# Patient Record
Sex: Male | Born: 1985 | Race: Black or African American | Hispanic: No | Marital: Single | Smoking: Current every day smoker
Health system: Southern US, Community
[De-identification: ages and names within clinical notes are randomized; demographics above are authoritative.]

## PROBLEM LIST (undated history)

## (undated) HISTORY — PX: OTHER SURGICAL HISTORY: SHX169

---

## 2003-01-10 ENCOUNTER — Emergency Department (HOSPITAL_COMMUNITY): Admission: EM | Admit: 2003-01-10 | Discharge: 2003-01-10 | Payer: Self-pay | Admitting: Emergency Medicine

## 2004-04-27 ENCOUNTER — Emergency Department (HOSPITAL_COMMUNITY): Admission: EM | Admit: 2004-04-27 | Discharge: 2004-04-28 | Payer: Self-pay | Admitting: Emergency Medicine

## 2004-05-31 ENCOUNTER — Emergency Department (HOSPITAL_COMMUNITY): Admission: EM | Admit: 2004-05-31 | Discharge: 2004-05-31 | Payer: Self-pay | Admitting: Family Medicine

## 2004-07-01 ENCOUNTER — Emergency Department (HOSPITAL_COMMUNITY): Admission: EM | Admit: 2004-07-01 | Discharge: 2004-07-01 | Payer: Self-pay | Admitting: Family Medicine

## 2004-08-10 ENCOUNTER — Emergency Department (HOSPITAL_COMMUNITY): Admission: EM | Admit: 2004-08-10 | Discharge: 2004-08-10 | Payer: Self-pay | Admitting: Family Medicine

## 2004-10-13 ENCOUNTER — Emergency Department (HOSPITAL_COMMUNITY): Admission: EM | Admit: 2004-10-13 | Discharge: 2004-10-13 | Payer: Self-pay | Admitting: Emergency Medicine

## 2004-10-16 ENCOUNTER — Emergency Department (HOSPITAL_COMMUNITY): Admission: EM | Admit: 2004-10-16 | Discharge: 2004-10-16 | Payer: Self-pay | Admitting: Family Medicine

## 2005-05-26 ENCOUNTER — Emergency Department (HOSPITAL_COMMUNITY): Admission: EM | Admit: 2005-05-26 | Discharge: 2005-05-26 | Payer: Self-pay | Admitting: Family Medicine

## 2005-08-29 ENCOUNTER — Emergency Department (HOSPITAL_COMMUNITY): Admission: EM | Admit: 2005-08-29 | Discharge: 2005-08-29 | Payer: Self-pay | Admitting: Family Medicine

## 2007-06-02 ENCOUNTER — Emergency Department (HOSPITAL_COMMUNITY): Admission: EM | Admit: 2007-06-02 | Discharge: 2007-06-03 | Payer: Self-pay | Admitting: Emergency Medicine

## 2007-06-05 ENCOUNTER — Emergency Department (HOSPITAL_COMMUNITY): Admission: EM | Admit: 2007-06-05 | Discharge: 2007-06-05 | Payer: Self-pay | Admitting: Emergency Medicine

## 2008-12-14 ENCOUNTER — Emergency Department (HOSPITAL_COMMUNITY): Admission: EM | Admit: 2008-12-14 | Discharge: 2008-12-14 | Payer: Self-pay | Admitting: Emergency Medicine

## 2009-05-02 ENCOUNTER — Emergency Department (HOSPITAL_COMMUNITY): Admission: EM | Admit: 2009-05-02 | Discharge: 2009-05-02 | Payer: Self-pay | Admitting: Emergency Medicine

## 2009-12-03 ENCOUNTER — Emergency Department (HOSPITAL_COMMUNITY): Admission: EM | Admit: 2009-12-03 | Discharge: 2009-12-03 | Payer: Self-pay | Admitting: Emergency Medicine

## 2010-06-06 LAB — HEMOCCULT GUIAC POC 1CARD (OFFICE): Fecal Occult Bld: NEGATIVE

## 2010-07-06 ENCOUNTER — Inpatient Hospital Stay (INDEPENDENT_AMBULATORY_CARE_PROVIDER_SITE_OTHER)
Admission: RE | Admit: 2010-07-06 | Discharge: 2010-07-06 | Disposition: A | Discharge status: Home / Self Care | Payer: BLUE CROSS/BLUE SHIELD | Source: Ambulatory Visit | Attending: Family Medicine | Admitting: Family Medicine

## 2010-07-06 DIAGNOSIS — S335XXA Sprain of ligaments of lumbar spine, initial encounter: Secondary | ICD-10-CM

## 2010-07-18 ENCOUNTER — Other Ambulatory Visit: Payer: Self-pay | Admitting: Occupational Medicine

## 2010-07-18 ENCOUNTER — Ambulatory Visit: Payer: Self-pay

## 2010-07-18 DIAGNOSIS — M549 Dorsalgia, unspecified: Secondary | ICD-10-CM

## 2010-07-31 ENCOUNTER — Emergency Department (HOSPITAL_COMMUNITY)
Admission: EM | Admit: 2010-07-31 | Discharge: 2010-08-01 | Disposition: A | Discharge status: Home / Self Care | Payer: No Typology Code available for payment source | Attending: Emergency Medicine | Admitting: Emergency Medicine

## 2010-07-31 DIAGNOSIS — T1490XA Injury, unspecified, initial encounter: Secondary | ICD-10-CM | POA: Insufficient documentation

## 2010-07-31 DIAGNOSIS — Y92410 Unspecified street and highway as the place of occurrence of the external cause: Secondary | ICD-10-CM | POA: Insufficient documentation

## 2010-07-31 DIAGNOSIS — M549 Dorsalgia, unspecified: Secondary | ICD-10-CM | POA: Insufficient documentation

## 2010-08-11 ENCOUNTER — Inpatient Hospital Stay (INDEPENDENT_AMBULATORY_CARE_PROVIDER_SITE_OTHER)
Admission: RE | Admit: 2010-08-11 | Discharge: 2010-08-11 | Disposition: A | Discharge status: Home / Self Care | Payer: Self-pay | Source: Ambulatory Visit | Attending: Emergency Medicine | Admitting: Emergency Medicine

## 2010-08-11 DIAGNOSIS — S40019A Contusion of unspecified shoulder, initial encounter: Secondary | ICD-10-CM

## 2010-08-11 DIAGNOSIS — M545 Low back pain: Secondary | ICD-10-CM

## 2010-08-19 ENCOUNTER — Emergency Department (HOSPITAL_COMMUNITY)
Admission: EM | Admit: 2010-08-19 | Discharge: 2010-08-19 | Disposition: A | Discharge status: Home / Self Care | Payer: No Typology Code available for payment source | Attending: Emergency Medicine | Admitting: Emergency Medicine

## 2010-08-19 ENCOUNTER — Emergency Department (HOSPITAL_COMMUNITY): Payer: No Typology Code available for payment source

## 2010-08-19 DIAGNOSIS — M25519 Pain in unspecified shoulder: Secondary | ICD-10-CM | POA: Insufficient documentation

## 2011-01-13 ENCOUNTER — Inpatient Hospital Stay (INDEPENDENT_AMBULATORY_CARE_PROVIDER_SITE_OTHER)
Admission: RE | Admit: 2011-01-13 | Discharge: 2011-01-13 | Disposition: A | Discharge status: Home / Self Care | Payer: BC Managed Care – PPO | Source: Ambulatory Visit | Attending: Emergency Medicine | Admitting: Emergency Medicine

## 2011-01-13 DIAGNOSIS — K5289 Other specified noninfective gastroenteritis and colitis: Secondary | ICD-10-CM

## 2011-04-17 ENCOUNTER — Encounter (HOSPITAL_COMMUNITY): Payer: Self-pay

## 2011-04-17 ENCOUNTER — Emergency Department (HOSPITAL_COMMUNITY)
Admission: EM | Admit: 2011-04-17 | Discharge: 2011-04-17 | Disposition: A | Discharge status: Home / Self Care | Payer: BC Managed Care – PPO | Source: Home / Self Care | Attending: Emergency Medicine | Admitting: Emergency Medicine

## 2011-04-17 DIAGNOSIS — J069 Acute upper respiratory infection, unspecified: Secondary | ICD-10-CM

## 2011-04-17 MED ORDER — GUAIFENESIN-CODEINE 100-10 MG/5ML PO SYRP: 5.0000 mL | ORAL_SOLUTION | Freq: Three times a day (TID) | ORAL | Status: AC | PRN

## 2011-04-17 MED ORDER — PREDNISONE 10 MG PO TABS: 20.0000 mg | ORAL_TABLET | Freq: Every day | ORAL | Status: AC

## 2011-04-17 NOTE — ED Notes (Signed)
C/o productive cough of green sputum, runny nose, nasal congestion since last Thursday.  states he awakened this am with headache and sore throat and feels really bad.

## 2011-04-17 NOTE — ED Provider Notes (Signed)
History     CSN: [id number]  Arrival date & time 04/17/11  1191   First MD Initiated Contact with Patient 04/17/11 [phone]      Chief Complaint  Patient presents with  . URI  . Sore Throat    (Consider location/radiation/quality/duration/timing/severity/associated sxs/prior treatment) HPI Comments: Still coughing with phlegm now, and nose still congested and runny nose, No SOB no fevers last few days, but now have a sore thraot  Patient is a 26 y.o. male presenting with URI and pharyngitis. The history is provided by the patient.  URI The primary symptoms include fever, fatigue, ear pain, sore throat and cough. Primary symptoms do not include wheezing, vomiting or rash. The current episode started 6 to 7 days ago. This is a new problem. The problem has not changed since onset. Symptoms associated with the illness include congestion and rhinorrhea.  Sore Throat Pertinent negatives include no shortness of breath.    History reviewed. No pertinent past medical history.  History reviewed. No pertinent past surgical history.  History reviewed. No pertinent family history.  History  Substance Use Topics  . Smoking status: Current Everyday Smoker -- 1.0 packs/day    Types: Cigarettes  . Smokeless tobacco: Not on file  . Alcohol Use: Yes     daily      Review of Systems  Constitutional: Positive for fever, appetite change and fatigue.  HENT: Positive for ear pain, congestion, sore throat and rhinorrhea.   Respiratory: Positive for cough. Negative for chest tightness, shortness of breath and wheezing.   Gastrointestinal: Negative for vomiting.  Skin: Negative for rash.    Allergies  Review of patient's allergies indicates no known allergies.  Home Medications   Current Outpatient Rx  Name Route Sig Dispense Refill  . GUAIFENESIN-CODEINE 100-10 MG/5ML PO SYRP Oral Take 5 mLs by mouth 3 (three) times daily as needed for cough. 120 mL 0  . PREDNISONE 10 MG PO TABS Oral  Take 2 tablets (20 mg total) by mouth daily. 15 tablet 0    BP 125/80  Pulse 97  Temp(Src) 99 F (37.2 C) (Oral)  Resp 14  SpO2 100%  Physical Exam  Nursing note reviewed. Constitutional: He appears well-developed and well-nourished. No distress.  HENT:  Head: Atraumatic.  Right Ear: No middle ear effusion.  Left Ear:  No middle ear effusion.  Nose: Rhinorrhea present.  Mouth/Throat: Uvula is midline and mucous membranes are normal. Posterior oropharyngeal erythema present.  Eyes: Conjunctivae are normal. Left eye exhibits no discharge.  Neck: Neck supple. No JVD present.  Cardiovascular: Normal rate and normal pulses.  Exam reveals no friction rub.   No murmur heard. Pulmonary/Chest: No respiratory distress. He has no decreased breath sounds. He has no wheezes. He has no rales. He exhibits no tenderness.  Lymphadenopathy:    He has no cervical adenopathy.    ED Course  Procedures (including critical care time)  Labs Reviewed - No data to display No results found.   1. Upper respiratory infection       MDM  URI x 6 days, with normal exam, symptomatic        Jimmie Molly, MD 04/17/11 1814

## 2011-09-29 ENCOUNTER — Emergency Department (HOSPITAL_COMMUNITY)
Admission: EM | Admit: 2011-09-29 | Discharge: 2011-09-30 | Disposition: A | Discharge status: Home / Self Care | Payer: BC Managed Care – PPO | Attending: Emergency Medicine | Admitting: Emergency Medicine

## 2011-09-29 ENCOUNTER — Encounter (HOSPITAL_COMMUNITY): Payer: Self-pay | Admitting: *Deleted

## 2011-09-29 DIAGNOSIS — X58XXXA Exposure to other specified factors, initial encounter: Secondary | ICD-10-CM | POA: Insufficient documentation

## 2011-09-29 DIAGNOSIS — S39012A Strain of muscle, fascia and tendon of lower back, initial encounter: Secondary | ICD-10-CM

## 2011-09-29 DIAGNOSIS — M545 Low back pain, unspecified: Secondary | ICD-10-CM | POA: Insufficient documentation

## 2011-09-29 DIAGNOSIS — F17200 Nicotine dependence, unspecified, uncomplicated: Secondary | ICD-10-CM | POA: Insufficient documentation

## 2011-09-29 DIAGNOSIS — S335XXA Sprain of ligaments of lumbar spine, initial encounter: Secondary | ICD-10-CM | POA: Insufficient documentation

## 2011-09-29 MED ORDER — HYDROCODONE-ACETAMINOPHEN 5-325 MG PO TABS
1.0000 | ORAL_TABLET | Freq: Once | ORAL | Status: AC
  Administered 2011-09-29: 1 via ORAL
  Filled 2011-09-29: qty 1

## 2011-09-29 MED ORDER — HYDROCODONE-ACETAMINOPHEN 5-325 MG PO TABS: ORAL_TABLET | ORAL | Status: AC

## 2011-09-29 MED ORDER — METHOCARBAMOL 500 MG PO TABS: 1000.0000 mg | ORAL_TABLET | Freq: Four times a day (QID) | ORAL | Status: AC

## 2011-09-29 MED ORDER — NAPROXEN 500 MG PO TABS: 500.0000 mg | ORAL_TABLET | Freq: Two times a day (BID) | ORAL | Status: DC

## 2011-09-29 NOTE — ED Provider Notes (Signed)
History     CSN: [id number]  Arrival date & time 09/29/11  2048   First MD Initiated Contact with Patient 09/29/11 2256      Chief Complaint  Patient presents with  . Back Pain    (Consider location/radiation/quality/duration/timing/severity/associated sxs/prior treatment) HPI Comments: Patient presents with complaint of lower back pain that began while he was working earlier today and has gradually progressed. Patient does a lot of heavy lifting at work. He denies acute injury. He denies red flag signs and symptoms of lower back pain. He denies treatments prior to arrival. Patient has a history of lower back strain. Pain does not radiate into his legs. He has not had difficulty walking. Onset was gradual. Course is constant. Nothing makes the symptoms better.   Patient is a 26 y.o. male presenting with back pain. The history is provided by the patient.  Back Pain  This is a new problem. The current episode started 6 to 12 hours ago. The problem occurs constantly. The problem has not changed since onset.The pain is associated with lifting heavy objects. The pain is present in the lumbar spine. The quality of the pain is described as aching. The pain does not radiate. The pain is moderate. The symptoms are aggravated by bending, certain positions and twisting. Pertinent negatives include no fever, no numbness, no weight loss, no bowel incontinence, no bladder incontinence, no dysuria, no tingling and no weakness. He has tried nothing for the symptoms. The treatment provided no relief.    History reviewed. No pertinent past medical history.  History reviewed. No pertinent past surgical history.  No family history on file.  History  Substance Use Topics  . Smoking status: Current Everyday Smoker -- 1.0 packs/day    Types: Cigarettes  . Smokeless tobacco: Not on file  . Alcohol Use: Yes     daily      Review of Systems  Constitutional: Negative for fever, weight loss and unexpected  weight change.  Gastrointestinal: Negative for constipation and bowel incontinence.       Neg for fecal incontinence  Genitourinary: Negative for bladder incontinence, dysuria, hematuria, flank pain and difficulty urinating.       Negative for urinary incontinence or retention  Musculoskeletal: Positive for back pain.  Neurological: Negative for tingling, weakness and numbness.       Negative for saddle paresthesias     Allergies  Review of patient's allergies indicates no known allergies.  Home Medications  No current outpatient prescriptions on file.  BP 126/88  Pulse 90  Temp 98.7 F (37.1 C) (Oral)  Resp 16  Ht 6' (1.829 m)  Wt 200 lb (90.719 kg)  BMI 27.12 kg/m2  SpO2 100%  Physical Exam  Nursing note and vitals reviewed. Constitutional: He is oriented to person, place, and time. He appears well-developed and well-nourished.  HENT:  Head: Normocephalic and atraumatic.  Eyes: Conjunctivae are normal.  Neck: Normal range of motion.  Abdominal: Soft. There is no tenderness. There is no CVA tenderness.  Musculoskeletal: Normal range of motion. He exhibits no tenderness.       Back:       There is no tenderness to palpation over cervical/thoracic/lumbar/sacral spine. Tenderness to palpation over lumbar paraspinal muscles. No step-off noted with palpation of spine.   Neurological: He is alert and oriented to person, place, and time. He has normal reflexes. No sensory deficit. He exhibits normal muscle tone.       5/5 strength in entire lower extremities  bilaterally. No sensation deficit.   Skin: Skin is warm and dry.  Psychiatric: He has a normal mood and affect.    ED Course  Procedures (including critical care time)  Labs Reviewed - No data to display No results found.   1. Lumbosacral strain     11:44 PM Patient seen and examined. Medications ordered.   Vital signs reviewed and are as follows: Filed Vitals:   09/29/11 2126  BP: 126/88  Pulse: 90  Temp:  98.7 F (37.1 C)  Resp: 16   No red flag s/s of low back pain. Patient was counseled on back pain precautions and told to do activity as tolerated but do not lift, push, or pull heavy objects more than 10 pounds for the next week.  Patient counseled to use ice or heat on back for no longer than 15 minutes every hour.   Patient prescribed muscle relaxer and counseled on proper use of muscle relaxant medication.    Patient prescribed narcotic pain medicine and counseled on proper use of narcotic pain medications. Counseled not to combine this medication with others containing tylenol.   Urged patient not to drink alcohol, drive, or perform any other activities that requires focus while taking either of these medications.  Patient urged to follow-up with PCP if pain does not improve with treatment and rest or if pain becomes recurrent. Urged to return with worsening severe pain, loss of bowel or bladder control, trouble walking.   The patient verbalizes understanding and agrees with the plan.  MDM  Patient with back pain. No neurological deficits. Patient is ambulatory. No warning symptoms of back pain including: loss of bowel or bladder control, night sweats, waking from sleep with back pain, unexplained fevers or weight loss, h/o cancer, IVDU, recent trauma. No concern for cauda equina, epidural abscess, or other serious cause of back pain. Conservative measures such as rest, ice/heat and pain medicine indicated with PCP follow-up if no improvement with conservative management.          Renne Crigler, [place] 10/02/11 1125

## 2011-09-29 NOTE — ED Notes (Signed)
Pt presents w/ back pain that began today - pt denies any known mechanism of injury, pt w/ hx of back pain in same area. Pt in no acute distress.

## 2011-09-29 NOTE — ED Notes (Signed)
Pt c/o reoccuring lower back pain x 1 day; back strain a year ago

## 2011-09-30 NOTE — ED Notes (Signed)
Rx given x3 Pt ambulating independently w/ steady gait on d/c in no acute distress, A&Ox4. D/c instructions reviewed w/ pt - pt denies any further questions or concerns at present.   

## 2011-10-03 NOTE — ED Provider Notes (Signed)
Medical screening examination/treatment/procedure(s) were performed by non-physician practitioner and as supervising physician I was immediately available for consultation/collaboration.    Celene Kras, MD 10/03/11 [phone]

## 2011-10-15 ENCOUNTER — Encounter (HOSPITAL_COMMUNITY): Payer: Self-pay | Admitting: Emergency Medicine

## 2011-10-15 ENCOUNTER — Emergency Department (HOSPITAL_COMMUNITY)
Admission: EM | Admit: 2011-10-15 | Discharge: 2011-10-15 | Disposition: A | Discharge status: Home / Self Care | Payer: BC Managed Care – PPO | Attending: Emergency Medicine | Admitting: Emergency Medicine

## 2011-10-15 ENCOUNTER — Emergency Department (HOSPITAL_COMMUNITY): Payer: BC Managed Care – PPO

## 2011-10-15 DIAGNOSIS — F17200 Nicotine dependence, unspecified, uncomplicated: Secondary | ICD-10-CM | POA: Insufficient documentation

## 2011-10-15 DIAGNOSIS — K6289 Other specified diseases of anus and rectum: Secondary | ICD-10-CM

## 2011-10-15 LAB — CBC
HCT: 44.3 % (ref 39.0–52.0)
MCHC: 34.1 g/dL (ref 30.0–36.0)
MCV: 93.3 fL (ref 78.0–100.0)
RBC: 4.75 MIL/uL (ref 4.22–5.81)
RDW: 13.4 % (ref 11.5–15.5)
WBC: 17.8 10*3/uL — ABNORMAL HIGH (ref 4.0–10.5)

## 2011-10-15 LAB — COMPREHENSIVE METABOLIC PANEL
AST: 26 U/L (ref 0–37)
Albumin: 4.2 g/dL (ref 3.5–5.2)
BUN: 15 mg/dL (ref 6–23)
Calcium: 9.6 mg/dL (ref 8.4–10.5)
GFR calc non Af Amer: 74 mL/min — ABNORMAL LOW (ref >90)
Total Bilirubin: 0.7 mg/dL (ref 0.3–1.2)
Total Protein: 7.5 g/dL (ref 6.0–8.3)

## 2011-10-15 MED ORDER — SODIUM CHLORIDE 0.9 % IV BOLUS (SEPSIS)
1000.0000 mL | Freq: Once | INTRAVENOUS | Status: AC
  Administered 2011-10-15: 1000 mL via INTRAVENOUS

## 2011-10-15 MED ORDER — MORPHINE SULFATE 4 MG/ML IJ SOLN
4.0000 mg | Freq: Once | INTRAMUSCULAR | Status: AC
  Administered 2011-10-15: 4 mg via INTRAVENOUS
  Filled 2011-10-15: qty 1

## 2011-10-15 MED ORDER — ONDANSETRON HCL 4 MG/2ML IJ SOLN
4.0000 mg | Freq: Once | INTRAMUSCULAR | Status: AC
  Administered 2011-10-15: 4 mg via INTRAVENOUS
  Filled 2011-10-15: qty 2

## 2011-10-15 MED ORDER — HYDROCODONE-ACETAMINOPHEN 5-500 MG PO TABS: 1.0000 | ORAL_TABLET | Freq: Four times a day (QID) | ORAL | Status: AC | PRN

## 2011-10-15 MED ORDER — DOCUSATE SODIUM 100 MG PO CAPS: 100.0000 mg | ORAL_CAPSULE | Freq: Two times a day (BID) | ORAL | Status: DC

## 2011-10-15 MED ORDER — HYDROCORTISONE 2.5 % RE CREA: TOPICAL_CREAM | RECTAL | Status: AC

## 2011-10-15 MED ORDER — KETOROLAC TROMETHAMINE 30 MG/ML IJ SOLN: 60.0000 mg | Freq: Once | INTRAMUSCULAR | Status: DC

## 2011-10-15 NOTE — ED Notes (Signed)
Rx given x3 Pt ambulating independently w/ steady gait on d/c in no acute distress, A&Ox4. D/c instructions reviewed w/ pt - pt denies any further questions or concerns at present.   

## 2011-10-15 NOTE — ED Notes (Signed)
Pt reports rectal pain denies bleeding hx rectal abscess

## 2011-10-15 NOTE — ED Notes (Signed)
Pt reports rectal pain x3-4 days, pt admits to straining w/ BMs however has been having regular bowel movements, pt states he feels pressure in rectal area "like a blockage," pt A&Ox4 in no acute distress on assessment. Pt has used cream and laxatives at home w/o relief. No obvious hemorrhoids noted on assessment.

## 2011-10-15 NOTE — ED Provider Notes (Signed)
History     CSN: [id number]  Arrival date & time 10/15/11  0000   First MD Initiated Contact with Patient 10/15/11 0139      Chief Complaint  Patient presents with  . Rectal Pain    (Consider location/radiation/quality/duration/timing/severity/associated sxs/prior treatment) Patient is a 26 y.o. male presenting with abdominal pain. The history is provided by the patient.  Abdominal Pain The primary symptoms of the illness include abdominal pain. Primary symptoms comment: rectal pain The current episode started yesterday. The onset of the illness was gradual. The problem has been gradually worsening.  Additional symptoms associated with the illness include constipation.    History reviewed. No pertinent past medical history.  Past Surgical History  Procedure Date  . Rectal abscess     History reviewed. No pertinent family history.  History  Substance Use Topics  . Smoking status: Current Everyday Smoker -- 1.0 packs/day    Types: Cigarettes  . Smokeless tobacco: Not on file  . Alcohol Use: Yes     daily      Review of Systems  Gastrointestinal: Positive for abdominal pain and constipation.  Genitourinary:       Rectal pain  All other systems reviewed and are negative.    Allergies  Review of patient's allergies indicates no known allergies.  Home Medications   Current Outpatient Rx  Name Route Sig Dispense Refill  . NAPROXEN 500 MG PO TABS Oral Take 1 tablet (500 mg total) by mouth 2 (two) times daily. 20 tablet 0    BP 98/54  Pulse 125  Temp 98.8 F (37.1 C) (Oral)  Resp 18  SpO2 97%  Physical Exam  Constitutional: He is oriented to person, place, and time. He appears well-developed and well-nourished.  HENT:  Head: Normocephalic and atraumatic.  Eyes: Conjunctivae are normal. Pupils are equal, round, and reactive to light.  Neck: Normal range of motion. Neck supple.  Cardiovascular: Regular rhythm, normal heart sounds and intact distal pulses.   Tachycardia present.   Pulmonary/Chest: Effort normal and breath sounds normal.  Abdominal: Soft. Bowel sounds are normal. There is no tenderness.  Genitourinary:       + rectal tenderness,  No bleeding,  No masses noted  Neurological: He is alert and oriented to person, place, and time.  Skin: Skin is warm and dry.  Psychiatric: He has a normal mood and affect. His behavior is normal. Judgment and thought content normal.    ED Course  Procedures (including critical care time)   Labs Reviewed  CBC   No results found.   No diagnosis found.    MDM  + rectal pain,  Tachycardia,  Hx of abscess,  Will ct pelvis,  Analgesia,  Ivf,  reassess        Juell Radney Lytle Michaels, MD 10/15/11 0155

## 2011-10-15 NOTE — ED Notes (Signed)
Patient transported to CT 

## 2011-10-16 ENCOUNTER — Emergency Department (HOSPITAL_COMMUNITY)
Admission: EM | Admit: 2011-10-16 | Discharge: 2011-10-16 | Disposition: A | Discharge status: Home / Self Care | Payer: BC Managed Care – PPO | Attending: Emergency Medicine | Admitting: Emergency Medicine

## 2011-10-16 ENCOUNTER — Encounter (HOSPITAL_COMMUNITY): Payer: Self-pay | Admitting: *Deleted

## 2011-10-16 DIAGNOSIS — K611 Rectal abscess: Secondary | ICD-10-CM

## 2011-10-16 DIAGNOSIS — F17200 Nicotine dependence, unspecified, uncomplicated: Secondary | ICD-10-CM | POA: Insufficient documentation

## 2011-10-16 DIAGNOSIS — K612 Anorectal abscess: Secondary | ICD-10-CM | POA: Insufficient documentation

## 2011-10-16 MED ORDER — MORPHINE SULFATE 4 MG/ML IJ SOLN
8.0000 mg | Freq: Once | INTRAMUSCULAR | Status: DC
  Filled 2011-10-16: qty 2

## 2011-10-16 MED ORDER — OXYCODONE-ACETAMINOPHEN 5-325 MG PO TABS: 1.0000 | ORAL_TABLET | ORAL | Status: AC | PRN

## 2011-10-16 MED ORDER — NAPROXEN 500 MG PO TABS: 500.0000 mg | ORAL_TABLET | Freq: Two times a day (BID) | ORAL | Status: AC

## 2011-10-16 MED ORDER — SULFAMETHOXAZOLE-TRIMETHOPRIM 800-160 MG PO TABS: 1.0000 | ORAL_TABLET | Freq: Two times a day (BID) | ORAL | Status: AC

## 2011-10-16 MED ORDER — SULFAMETHOXAZOLE-TMP DS 800-160 MG PO TABS
1.0000 | ORAL_TABLET | Freq: Once | ORAL | Status: AC
  Administered 2011-10-16: 1 via ORAL
  Filled 2011-10-16: qty 1

## 2011-10-16 NOTE — ED Provider Notes (Signed)
History     CSN: [id number]  Arrival date & time 10/16/11  1478   First MD Initiated Contact with Patient 10/16/11 [phone]      Chief Complaint  Patient presents with  . Rectal Pain    (Consider location/radiation/quality/duration/timing/severity/associated sxs/prior treatment) HPI Comments: 26 year old male with a history of rectal pain which started several days ago. He was seen in the emergency department had a rectal exam which was tender but no masses were seen at that time. He also had a CT scan of the abdomen and pelvis showing no signs of perirectal or perianal abscesses. He states over the last 24 hours he feels like there is a mass that has increased in size in the right perirectal area which is worse with bowel movements, sitting and touching. He denies fevers chills nausea or vomiting. The symptoms are persistent, gradually getting worse.  The history is provided by the patient and medical records.    History reviewed. No pertinent past medical history.  Past Surgical History  Procedure Date  . Rectal abscess     No family history on file.  History  Substance Use Topics  . Smoking status: Current Everyday Smoker -- 1.0 packs/day    Types: Cigarettes  . Smokeless tobacco: Not on file  . Alcohol Use: Yes     daily      Review of Systems  Constitutional: Negative for fever and chills.  Gastrointestinal: Positive for rectal pain. Negative for nausea and vomiting.  Skin:       abscess    Allergies  Shellfish allergy  Home Medications   Current Outpatient Rx  Name Route Sig Dispense Refill  . HYDROCODONE-ACETAMINOPHEN 5-500 MG PO TABS Oral Take 1-2 tablets by mouth every 6 (six) hours as needed for pain. 15 tablet 0  . HYDROCORTISONE 2.5 % RE CREA  Apply rectally 2 times daily 30 g 0  . NAPROXEN 500 MG PO TABS Oral Take 1 tablet (500 mg total) by mouth 2 (two) times daily with a meal. 30 tablet 0  . OXYCODONE-ACETAMINOPHEN 5-325 MG PO TABS Oral Take 1  tablet by mouth every 4 (four) hours as needed for pain. 20 tablet 0  . SULFAMETHOXAZOLE-TRIMETHOPRIM 800-160 MG PO TABS Oral Take 1 tablet by mouth every 12 (twelve) hours. 20 tablet 0    BP 140/69  Pulse 118  Temp 98.2 F (36.8 C) (Oral)  Resp 20  SpO2 99%  Physical Exam  Nursing note and vitals reviewed. Constitutional: He appears well-developed and well-nourished.       Uncomfortable appearing  HENT:  Head: Normocephalic and atraumatic.  Eyes: Conjunctivae are normal. Right eye exhibits no discharge. Left eye exhibits no discharge. No scleral icterus.  Cardiovascular: Normal rate and regular rhythm.   No murmur heard. Pulmonary/Chest: Effort normal and breath sounds normal.  Abdominal: Soft. There is no tenderness.  Genitourinary:       Tender swollen indurated area without fluctuance in the left superior perirectal area. No tenderness in the perineum otherwise  Musculoskeletal: He exhibits tenderness. He exhibits no edema.  Skin: Skin is warm and dry. He is not diaphoretic.       Swollen area in the left superior perirectal area. Tenderness with palpation, indurated skin, no areas of fluctuance.    ED Course  Procedures (including critical care time)  Labs Reviewed - No data to display Ct Abdomen Pelvis Wo Contrast  10/15/2011  *RADIOLOGY REPORT*  Clinical Data: Rectal pain and pressure.  CT ABDOMEN AND  PELVIS WITHOUT CONTRAST  Technique:  Multidetector CT imaging of the abdomen and pelvis was performed following the standard protocol without intravenous contrast.  Comparison: None.  Findings: The patient was unable to lay supine and accordingly was scanned in the prone position.  Subsegmental atelectasis noted in the right middle lobe.  The visualized portion of the liver, spleen, pancreas, and adrenal glands appear unremarkable in noncontrast CT appearance.  The gallbladder is moderately contracted. The kidneys appear unremarkable, as do the proximal ureters.  Distal ureters  appear normal.  No pathologic retroperitoneal or porta hepatis adenopathy is identified.  The appendix appears normal. No pathologic pelvic adenopathy is identified.  There are some distal air-fluid levels in the colon and in the rectum, suggesting constipation.  No obvious rectal mass is observed on noncontrast CT.  No abnormal stranding in the issue rectal fossa.  IMPRESSION:  1.  Air fluid levels in the distal colon raise the possibility of diarrheal process.   Otherwise, no significant abnormality identified.  Original Report Authenticated By: Dellia Cloud, M.D.     1. Perirectal abscess       MDM  The patient appears to have an early perirectal abscess, we'll perform ultrasound on this abscess to evaluate size and evaluate indications for incision and drainage versus antibiotic therapy. Intramuscular morphine ordered. CT scan results reviewed, see above.  Bedside US shows no identifiable fluid collection =- appears c/w early infection - bactrim ordered, pt drove self and requests Rx for meds rather than IM morphine.  No fever.    Discharge Prescriptions include:  Bactrim Naprosyn Percocet  Resource list.      Vida Roller, MD 10/16/11 [phone]

## 2011-10-16 NOTE — ED Notes (Signed)
Dr Miller at bedside. 

## 2011-10-16 NOTE — ED Notes (Signed)
Pt was tx here Tues night for rectal pain and dx with proctalgia fugax.  He was told to come back if the pain increased.  Pt has taken all medications except for the stool softener which he could not afford.  Pt appears in great pain.

## 2012-10-18 ENCOUNTER — Emergency Department (INDEPENDENT_AMBULATORY_CARE_PROVIDER_SITE_OTHER)
Admission: EM | Admit: 2012-10-18 | Discharge: 2012-10-18 | Disposition: A | Discharge status: Home / Self Care | Payer: BC Managed Care – PPO | Source: Home / Self Care | Attending: Family Medicine | Admitting: Family Medicine

## 2012-10-18 ENCOUNTER — Encounter (HOSPITAL_COMMUNITY): Payer: Self-pay | Admitting: Emergency Medicine

## 2012-10-18 DIAGNOSIS — M75100 Unspecified rotator cuff tear or rupture of unspecified shoulder, not specified as traumatic: Secondary | ICD-10-CM

## 2012-10-18 DIAGNOSIS — M25519 Pain in unspecified shoulder: Secondary | ICD-10-CM

## 2012-10-18 DIAGNOSIS — M25512 Pain in left shoulder: Secondary | ICD-10-CM

## 2012-10-18 DIAGNOSIS — IMO0002 Reserved for concepts with insufficient information to code with codable children: Secondary | ICD-10-CM

## 2012-10-18 DIAGNOSIS — M7552 Bursitis of left shoulder: Secondary | ICD-10-CM

## 2012-10-18 MED ORDER — TRIAMCINOLONE ACETONIDE 40 MG/ML IJ SUSP
INTRAMUSCULAR | Status: AC
Start: 2012-10-18 — End: 2012-10-18
  Filled 2012-10-18: qty 1

## 2012-10-18 MED ORDER — IBUPROFEN 800 MG PO TABS: 800.0000 mg | ORAL_TABLET | Freq: Three times a day (TID) | ORAL | Status: DC

## 2012-10-18 MED ORDER — HYDROCODONE-ACETAMINOPHEN 10-325 MG PO TABS: 1.0000 | ORAL_TABLET | Freq: Four times a day (QID) | ORAL | Status: DC | PRN

## 2012-10-18 NOTE — ED Provider Notes (Signed)
CSN: [id number]     Arrival date & time 10/18/12  1840 History     None    Chief Complaint  Patient presents with  . Shoulder Pain   (Consider location/radiation/quality/duration/timing/severity/associated sxs/prior Treatment) HPI Comments: 27 year old male with a history of bursitis in both shoulders presents complaining of pain in his left shoulder, worse with movement. He has had this exact pain before when he was diagnosed with bursitis but before it was worse in the right shoulder. This pain was present when he woke up yesterday morning. The day before, he did do a lot of lifting of heavy objects at work but there is no specific injury and he did not have pain when he went to sleep at night. He says the pain is severe, 10 out of 10 with moving. Denies any swelling or history of shoulder injury.   History reviewed. No pertinent past medical history. Past Surgical History  Procedure Laterality Date  . Rectal abscess     No family history on file. History  Substance Use Topics  . Smoking status: Current Every Day Smoker -- 1.00 packs/day    Types: Cigarettes  . Smokeless tobacco: Not on file  . Alcohol Use: Yes     Comment: daily    Review of Systems  Constitutional: Negative for fever, chills and fatigue.  HENT: Negative for sore throat, neck pain and neck stiffness.   Eyes: Negative for visual disturbance.  Respiratory: Negative for cough and shortness of breath.   Cardiovascular: Negative for chest pain, palpitations and leg swelling.  Gastrointestinal: Negative for nausea, vomiting, abdominal pain, diarrhea and constipation.  Genitourinary: Negative for dysuria, urgency, frequency and hematuria.  Musculoskeletal: Positive for arthralgias. Negative for myalgias.  Skin: Negative for rash.  Neurological: Negative for dizziness, weakness and light-headedness.    Allergies  Shellfish allergy  Home Medications   Current Outpatient Rx  Name  Route  Sig  Dispense  Refill   . HYDROcodone-acetaminophen (NORCO) 10-325 MG per tablet   Oral   Take 1 tablet by mouth every 6 (six) hours as needed for pain.   20 tablet   0   . ibuprofen (ADVIL,MOTRIN) 800 MG tablet   Oral   Take 1 tablet (800 mg total) by mouth 3 (three) times daily.   60 tablet   0    BP 144/96  Pulse 80  Temp(Src) 98.6 F (37 C) (Oral)  Resp 18  SpO2 99% Physical Exam  Nursing note and vitals reviewed. Constitutional: He is oriented to person, place, and time. He appears well-developed and well-nourished. No distress.  HENT:  Head: Normocephalic and atraumatic.  Eyes: EOM are normal. Pupils are equal, round, and reactive to light.  Musculoskeletal:       Left shoulder: He exhibits decreased range of motion, tenderness (at the [facility] joint line) and pain. He exhibits no bony tenderness, no swelling, no effusion, no crepitus, no deformity and normal strength.  Neurological: He is oriented to person, place, and time.  Skin: Skin is warm and dry. No rash noted.  Psychiatric: He has a normal mood and affect. Judgment normal.   Pain along the glenohumeral joint line, tenderness ED Course   Procedures (including critical care time)  Labs Reviewed - No data to display No results found. 1. Shoulder pain, acute, left   2. Subacromial bursitis, left     MDM  Subacromial bursa injection performed with 5 mL of 2% lidocaine and 40 mg of Kenalog., he is feeling better  afterward. 800 mg Motrin and Norco, followup with orthopedics.   Meds ordered this encounter  Medications  . ibuprofen (ADVIL,MOTRIN) 800 MG tablet    Sig: Take 1 tablet (800 mg total) by mouth 3 (three) times daily.    Dispense:  60 tablet    Refill:  0  . HYDROcodone-acetaminophen (NORCO) 10-325 MG per tablet    Sig: Take 1 tablet by mouth every 6 (six) hours as needed for pain.    Dispense:  20 tablet    Refill:  0     Graylon Good, PA-C 10/19/12 [phone]

## 2012-10-18 NOTE — ED Notes (Signed)
Pt c/o left shoulder pain onset this am... Denies inj/strenuous activity... Hx of bursitis of right shoulder and he believes he has the same on left shoulder... Pain is constant and increases w/activity... Alert w/no signs of acute distress.

## 2012-10-19 NOTE — ED Provider Notes (Signed)
Medical screening examination/treatment/procedure(s) were performed by resident physician or non-physician practitioner and as supervising physician I was immediately available for consultation/collaboration.   Caelen Reierson DOUGLAS MD.   Nilay Mangrum D Ziyana Morikawa, MD 10/19/12 2109 

## 2013-06-24 ENCOUNTER — Emergency Department (HOSPITAL_COMMUNITY)
Admission: EM | Admit: 2013-06-24 | Discharge: 2013-06-24 | Disposition: A | Discharge status: Home / Self Care | Payer: BC Managed Care – PPO | Source: Home / Self Care | Attending: Family Medicine | Admitting: Family Medicine

## 2013-06-24 ENCOUNTER — Encounter (HOSPITAL_COMMUNITY): Payer: Self-pay | Admitting: Emergency Medicine

## 2013-06-24 DIAGNOSIS — K5289 Other specified noninfective gastroenteritis and colitis: Secondary | ICD-10-CM

## 2013-06-24 DIAGNOSIS — K529 Noninfective gastroenteritis and colitis, unspecified: Secondary | ICD-10-CM

## 2013-06-24 MED ORDER — ONDANSETRON 4 MG PO TBDP
8.0000 mg | ORAL_TABLET | Freq: Once | ORAL | Status: AC
  Administered 2013-06-24: 8 mg via ORAL

## 2013-06-24 MED ORDER — ONDANSETRON HCL 4 MG PO TABS: 4.0000 mg | ORAL_TABLET | Freq: Four times a day (QID) | ORAL | Status: DC

## 2013-06-24 MED ORDER — ONDANSETRON 4 MG PO TBDP
ORAL_TABLET | ORAL | Status: AC
  Filled 2013-06-24: qty 2

## 2013-06-24 NOTE — ED Notes (Signed)
Reports onset Wednesday of not feeling well, stomach unsettled.  Patient reports nausea, vomiting and diarrhea and heartburn.  Patient reports one episode of diarrhea, no episodes of vomiting today

## 2013-06-24 NOTE — ED Provider Notes (Signed)
CSN: [id number]     Arrival date & time 06/24/13  0831 History   First MD Initiated Contact with Patient 06/24/13 [phone]     Chief Complaint  Patient presents with  . Abdominal Pain   (Consider location/radiation/quality/duration/timing/severity/associated sxs/prior Treatment) Patient is a 28 y.o. male presenting with abdominal pain. The history is provided by the patient.  Abdominal Pain Pain location:  Epigastric Pain quality: cramping   Pain radiates to:  Does not radiate Pain severity:  Mild Onset quality:  Gradual Duration:  2 days Progression:  Improving Chronicity:  New Relieved by:  None tried Worsened by:  Nothing tried Ineffective treatments:  None tried Associated symptoms: diarrhea, nausea and vomiting   Associated symptoms: no anorexia, no hematochezia and no melena   Associated symptoms comment:  Ate pizza yest.   History reviewed. No pertinent past medical history. Past Surgical History  Procedure Laterality Date  . Rectal abscess     No family history on file. History  Substance Use Topics  . Smoking status: Current Every Day Smoker -- 1.00 packs/day    Types: Cigarettes  . Smokeless tobacco: Not on file  . Alcohol Use: Yes     Comment: daily    Review of Systems  Constitutional: Negative.   Gastrointestinal: Positive for nausea, vomiting, abdominal pain and diarrhea. Negative for melena, hematochezia and anorexia.    Allergies  Shellfish allergy  Home Medications   Current Outpatient Rx  Name  Route  Sig  Dispense  Refill  . HYDROcodone-acetaminophen (NORCO) 10-325 MG per tablet   Oral   Take 1 tablet by mouth every 6 (six) hours as needed for pain.   20 tablet   0   . ibuprofen (ADVIL,MOTRIN) 800 MG tablet   Oral   Take 1 tablet (800 mg total) by mouth 3 (three) times daily.   60 tablet   0   . ondansetron (ZOFRAN) 4 MG tablet   Oral   Take 1 tablet (4 mg total) by mouth every 6 (six) hours.   6 tablet   0    BP 137/99  Pulse 68   Temp(Src) 98.2 F (36.8 C) (Oral)  Resp 20  SpO2 99% Physical Exam  Nursing note and vitals reviewed. Constitutional: He is oriented to person, place, and time. He appears well-developed and well-nourished. No distress.  HENT:  Mouth/Throat: Oropharynx is clear and moist.  Neck: Normal range of motion. Neck supple.  Cardiovascular: Normal heart sounds.   Pulmonary/Chest: Effort normal and breath sounds normal.  Abdominal: Soft. Bowel sounds are normal. He exhibits no distension and no mass. There is no tenderness. There is no rebound and no guarding.  Lymphadenopathy:    He has no cervical adenopathy.  Neurological: He is alert and oriented to person, place, and time.  Skin: Skin is warm and dry.    ED Course  Procedures (including critical care time) Labs Review Labs Reviewed - No data to display Imaging Review No results found.   MDM   1. Gastroenteritis, acute        Linna HoffJames D Jannell Franta, MD 06/24/13 [phone]

## 2013-06-24 NOTE — Discharge Instructions (Signed)
Clear liquid , bland diet today as tolerated, advance on sat as improved, use medicine as needed, imodium for diarrhea,  return or see your doctor if any problems. °

## 2013-07-26 ENCOUNTER — Emergency Department (HOSPITAL_COMMUNITY): Payer: BC Managed Care – PPO

## 2013-07-26 ENCOUNTER — Encounter (HOSPITAL_COMMUNITY): Payer: Self-pay | Admitting: Emergency Medicine

## 2013-07-26 ENCOUNTER — Emergency Department (HOSPITAL_COMMUNITY)
Admission: EM | Admit: 2013-07-26 | Discharge: 2013-07-26 | Disposition: A | Discharge status: Home / Self Care | Payer: BC Managed Care – PPO | Attending: Emergency Medicine | Admitting: Emergency Medicine

## 2013-07-26 DIAGNOSIS — M25572 Pain in left ankle and joints of left foot: Secondary | ICD-10-CM

## 2013-07-26 DIAGNOSIS — M25579 Pain in unspecified ankle and joints of unspecified foot: Secondary | ICD-10-CM | POA: Insufficient documentation

## 2013-07-26 DIAGNOSIS — Z87828 Personal history of other (healed) physical injury and trauma: Secondary | ICD-10-CM | POA: Insufficient documentation

## 2013-07-26 DIAGNOSIS — F17200 Nicotine dependence, unspecified, uncomplicated: Secondary | ICD-10-CM | POA: Insufficient documentation

## 2013-07-26 MED ORDER — MELOXICAM 7.5 MG PO TABS: 15.0000 mg | ORAL_TABLET | Freq: Every day | ORAL | Status: AC

## 2013-07-26 MED ORDER — TRAMADOL HCL 50 MG PO TABS: 50.0000 mg | ORAL_TABLET | Freq: Four times a day (QID) | ORAL | Status: AC | PRN

## 2013-07-26 NOTE — Discharge Instructions (Signed)
Arthralgia Arthralgia is joint pain. A joint is a place where two bones meet. Joint pain can happen for many reasons. The joint can be bruised, stiff, infected, or weak from aging. Pain usually goes away after resting and taking medicine for soreness.  HOME CARE  Rest the joint as told by your doctor.  Keep the sore joint raised (elevated) for the first 24 hours.  Put ice on the joint area.  Put ice in a plastic bag.  Place a towel between your skin and the bag.  Leave the ice on for 15-20 minutes, 03-04 times a day.  Wear your splint, casting, elastic bandage, or sling as told by your doctor.  Only take medicine as told by your doctor. Do not take aspirin.  Use crutches as told by your doctor. Do not put weight on the joint until told to by your doctor. GET HELP RIGHT AWAY IF:   You have bruising, puffiness (swelling), or more pain.  Your fingers or toes turn blue or start to lose feeling (numb).  Your medicine does not lessen the pain.  Your pain becomes severe.  You have a temperature by mouth above 102 F (38.9 C), not controlled by medicine.  You cannot move or use the joint. MAKE SURE YOU:   Understand these instructions.  Will watch your condition.  Will get help right away if you are not doing well or get worse. Document Released: 02/26/2009 Document Revised: 06/02/2011 Document Reviewed: 02/26/2009 [facility] Patient Information 2014 [place], [place].  Ankle Pain Ankle pain is a common symptom. The bones, cartilage, tendons, and muscles of the ankle joint perform a lot of work each day. The ankle joint holds your body weight and allows you to move around. Ankle pain can occur on either side or back of 1 or both ankles. Ankle pain may be sharp and burning or dull and aching. There may be tenderness, stiffness, redness, or warmth around the ankle. The pain occurs more often when a person walks or puts pressure on the ankle. CAUSES  There are many reasons ankle pain  can develop. It is important to work with your caregiver to identify the cause since many conditions can impact the bones, cartilage, muscles, and tendons. Causes for ankle pain include:  Injury, including a break (fracture), sprain, or strain often due to a fall, sports, or a high-impact activity.  Swelling (inflammation) of a tendon (tendonitis).  Achilles tendon rupture.  Ankle instability after repeated sprains and strains.  Poor foot alignment.  Pressure on a nerve (tarsal tunnel syndrome).  Arthritis in the ankle or the lining of the ankle.  Crystal formation in the ankle (gout or pseudogout). DIAGNOSIS  A diagnosis is based on your medical history, your symptoms, results of your physical exam, and results of diagnostic tests. Diagnostic tests may include X-ray exams or a computerized magnetic scan (magnetic resonance imaging, MRI). TREATMENT  Treatment will depend on the cause of your ankle pain and may include:  Keeping pressure off the ankle and limiting activities.  Using crutches or other walking support (a cane or brace).  Using rest, ice, compression, and elevation.  Participating in physical therapy or home exercises.  Wearing shoe inserts or special shoes.  Losing weight.  Taking medications to reduce pain or swelling or receiving an injection.  Undergoing surgery. HOME CARE INSTRUCTIONS   Only take over-the-counter or prescription medicines for pain, discomfort, or fever as directed by your caregiver.  Put ice on the injured area.  Put  ice in a plastic bag.  Place a towel between your skin and the bag.  Leave the ice on for 15-20 minutes at a time, 03-04 times a day.  Keep your leg raised (elevated) when possible to lessen swelling.  Avoid activities that cause ankle pain.  Follow specific exercises as directed by your caregiver.  Record how often you have ankle pain, the location of the pain, and what it feels like. This information may be  helpful to you and your caregiver.  Ask your caregiver about returning to work or sports and whether you should drive.  Follow up with your caregiver for further examination, therapy, or testing as directed. SEEK MEDICAL CARE IF:   Pain or swelling continues or worsens beyond 1 week.  You have an oral temperature above 102 F (38.9 C).  You are feeling unwell or have chills.  You are having an increasingly difficult time with walking.  You have loss of sensation or other new symptoms.  You have questions or concerns. MAKE SURE YOU:   Understand these instructions.  Will watch your condition.  Will get help right away if you are not doing well or get worse. Document Released: 08/28/2009 Document Revised: 06/02/2011 Document Reviewed: 08/28/2009 [facility] Patient Information 2014 [place], [place].

## 2013-07-26 NOTE — ED Notes (Signed)
Pt reports ongoing pain from [place] [employer]. Tore some ligaments in foot- pain has gotten worse to left foot. Pt ambulatory.

## 2013-07-26 NOTE — ED Provider Notes (Signed)
CSN: [id number]     Arrival date & time 07/26/13  1419 History   First MD Initiated Contact with Patient 07/26/13 1600     Chief Complaint  Patient presents with  . Foot Injury     (Consider location/radiation/quality/duration/timing/severity/associated sxs/prior Treatment) HPI Patient is a 28 year old male complaining of one-week long history of gradually worsening left ankle pain. Pain is constant aching worse with ambulation. Moderate in severity with exertion. Reports remote history of left ankle injury in high school in 2001. Denies history of surgery to left ankle. Denies taking medication at home for pain. Denies redness warmth or wound area. Denies known injury. Patient requesting imaging of the foot as he believes there is a fracture of old injury due to wear and tear from walking.    History reviewed. No pertinent past medical history. Past Surgical History  Procedure Laterality Date  . Rectal abscess     No family history on file. History  Substance Use Topics  . Smoking status: Current Every Day Smoker -- 1.00 packs/day    Types: Cigarettes  . Smokeless tobacco: Not on file  . Alcohol Use: Yes     Comment: daily    Review of Systems  Musculoskeletal: Positive for arthralgias, joint swelling and myalgias. Negative for gait problem and neck stiffness.       Left ankle  Skin: Negative for color change and wound.  All other systems reviewed and are negative.     Allergies  Shellfish allergy  Home Medications   Prior to Admission medications   Not on File   BP 150/103  Pulse 76  Temp(Src) 98.6 F (37 C) (Oral)  Resp 18  Ht 6' (1.829 m)  Wt 208 lb (94.348 kg)  BMI 28.20 kg/m2  SpO2 95% Physical Exam  Nursing note and vitals reviewed. Constitutional: He is oriented to person, place, and time. He appears well-developed and well-nourished.  HENT:  Head: Normocephalic and atraumatic.  Eyes: EOM are normal.  Neck: Normal range of motion.  Cardiovascular:  Normal rate.   Pulses:      Dorsalis pedis pulses are 2+ on the left side.       Posterior tibial pulses are 2+ on the left side.  Pulmonary/Chest: Effort normal.  Musculoskeletal: Normal range of motion. He exhibits edema ( mild-left ankle) and tenderness.  Mild circumferential tenderness to left ankle, with mild edema on dorsal aspect left ankle. FROM. 5/5 strength with plantar flexion and dorsiflexion.   Neurological: He is alert and oriented to person, place, and time.  Sensation in tact.  Skin: Skin is warm and dry.  Skin in tact. No ecchymosis, erythema, or warmth. No red streaking, induration, or evidence of underlying infection.  Psychiatric: He has a normal mood and affect. His behavior is normal.    ED Course  Procedures (including critical care time) Labs Review Labs Reviewed - No data to display  Imaging Review Dg Foot Complete Left  07/26/2013   CLINICAL DATA:  Pain, no known injury  EXAM: LEFT FOOT - COMPLETE 3+ VIEW  COMPARISON:  04/27/2004  FINDINGS: Three views of left foot submitted. No acute fracture or subluxation. There is old fracture at the base of fifth metatarsal. The fracture fragment is well corticated.  IMPRESSION: No acute fracture or subluxation. Old fracture at the base of fifth metatarsal.   Electronically Signed   By: Natasha MeadLiviu  Pop M.D.   On: 07/26/2013 16:40     EKG Interpretation None  MDM   Final diagnoses:  Left ankle pain    Patient complaining of one-week history left ankle pain. Pt believes exacerbated an old ankle injury. Plain films no acute fracture or subluxation. Evidence of old fracture at base of fifth metatarsal.  Will treat as arthralgia. Ace wrap provided. Advised to followup with PCP in orthopedics.    Junius Finnerrin O'Malley, PA-C 07/26/13 1652

## 2013-08-02 NOTE — ED Provider Notes (Signed)
Medical screening examination/treatment/procedure(s) were performed by non-physician practitioner and as supervising physician I was immediately available for consultation/collaboration.   EKG Interpretation None        Rolland PorterMark Savien Mamula, MD 08/02/13 1948

## 2013-12-06 ENCOUNTER — Encounter (HOSPITAL_COMMUNITY): Payer: Self-pay | Admitting: Emergency Medicine

## 2013-12-06 ENCOUNTER — Emergency Department (HOSPITAL_COMMUNITY)
Admission: EM | Admit: 2013-12-06 | Discharge: 2013-12-06 | Disposition: A | Discharge status: Home / Self Care | Payer: BC Managed Care – PPO | Attending: Emergency Medicine | Admitting: Emergency Medicine

## 2013-12-06 ENCOUNTER — Emergency Department (HOSPITAL_COMMUNITY): Payer: BC Managed Care – PPO

## 2013-12-06 DIAGNOSIS — J069 Acute upper respiratory infection, unspecified: Secondary | ICD-10-CM

## 2013-12-06 DIAGNOSIS — R059 Cough, unspecified: Secondary | ICD-10-CM | POA: Insufficient documentation

## 2013-12-06 DIAGNOSIS — Z791 Long term (current) use of non-steroidal anti-inflammatories (NSAID): Secondary | ICD-10-CM | POA: Insufficient documentation

## 2013-12-06 DIAGNOSIS — R05 Cough: Secondary | ICD-10-CM | POA: Insufficient documentation

## 2013-12-06 DIAGNOSIS — F17200 Nicotine dependence, unspecified, uncomplicated: Secondary | ICD-10-CM | POA: Insufficient documentation

## 2013-12-06 MED ORDER — ALBUTEROL SULFATE HFA 108 (90 BASE) MCG/ACT IN AERS
2.0000 | INHALATION_SPRAY | RESPIRATORY_TRACT | Status: DC | PRN
  Administered 2013-12-06: 2 via RESPIRATORY_TRACT
  Filled 2013-12-06: qty 6.7

## 2013-12-06 MED ORDER — HYDROCODONE-HOMATROPINE 5-1.5 MG/5ML PO SYRP: 5.0000 mL | ORAL_SOLUTION | Freq: Four times a day (QID) | ORAL | Status: AC | PRN

## 2013-12-06 NOTE — ED Notes (Signed)
Patient states he is "having bronchitis".  Patient states he has a productive cough with yellow return  X 2 days.    Patient denies any history of asthma.

## 2013-12-06 NOTE — ED Provider Notes (Signed)
CSN: [id number]     Arrival date & time 12/06/13  1112 History   First MD Initiated Contact with Patient 12/06/13 1205     Chief Complaint  Patient presents with  . Cough     (Consider location/radiation/quality/duration/timing/severity/associated sxs/prior Treatment) HPI Comments: Patient presents today with a chief complaint of productive cough and wheezing.  Symptoms have been present for the past 3 days and gradually worsening.   He has been taking Alka-Seltzer Day and Night without relief.   He reports that he has a history of Bronchitis.  He currently smokes 0.5 ppd.  He reports associated SOB, but denies chest pain or fever.   Denies sinus pain.     The history is provided by the patient.    History reviewed. No pertinent past medical history. Past Surgical History  Procedure Laterality Date  . Rectal abscess     No family history on file. History  Substance Use Topics  . Smoking status: Current Every Day Smoker -- 1.00 packs/day    Types: Cigarettes  . Smokeless tobacco: Not on file  . Alcohol Use: Yes     Comment: daily    Review of Systems  All other systems reviewed and are negative.     Allergies  Shellfish allergy  Home Medications   Prior to Admission medications   Medication Sig Start Date End Date Taking? Authorizing Provider  meloxicam (MOBIC) 7.5 MG tablet Take 2 tablets (15 mg total) by mouth daily. 07/26/13   Junius Finner, PA-C  traMADol (ULTRAM) 50 MG tablet Take 1 tablet (50 mg total) by mouth every 6 (six) hours as needed. 07/26/13   Junius Finner, PA-C   BP 143/98  Pulse 77  Temp(Src) 98.1 F (36.7 C) (Oral)  Resp 20  SpO2 100% Physical Exam  Nursing note and vitals reviewed. Constitutional: He appears well-developed and well-nourished. No distress.  HENT:  Head: Normocephalic and atraumatic.  Mouth/Throat: Oropharynx is clear and moist.  Neck: Normal range of motion. Neck supple.  Cardiovascular: Normal rate, regular rhythm and normal  heart sounds.   Pulmonary/Chest: Effort normal and breath sounds normal. No respiratory distress. He has no wheezes. He has no rales. He exhibits no tenderness.  Musculoskeletal: Normal range of motion.  Neurological: He is alert.  Skin: Skin is warm and dry. He is not diaphoretic.  Psychiatric: He has a normal mood and affect.    ED Course  Procedures (including critical care time) Labs Review Labs Reviewed - No data to display  Imaging Review Dg Chest 2 View (if Patient Has Fever And/or Copd)  12/06/2013   CLINICAL DATA:  Cough, smoker  EXAM: CHEST  2 VIEW  COMPARISON:  CT chest 01/15/2012  FINDINGS: The heart size and mediastinal contours are within normal limits. Both lungs are clear. The visualized skeletal structures are unremarkable.  IMPRESSION: No active cardiopulmonary disease.   Electronically Signed   By: Elige Ko   On: 12/06/2013 12:01     EKG Interpretation None      MDM   Final diagnoses:  None   Pt CXR negative for acute infiltrate. Patients symptoms are consistent with URI, likely viral etiology. Discussed that antibiotics are not indicated for viral infections. Pt will be discharged with symptomatic treatment.  Verbalizes understanding and is agreeable with plan. Pt is hemodynamically stable & in NAD prior to dc. Pulse ox 100 on RA.  Patient given Albuterol inhaler and cough suppressant.  Return precautions given.     Santiago Glad,  PA-C 12/06/13 1251  Santiago Glad, PA-C 12/06/13 1251

## 2013-12-06 NOTE — Discharge Instructions (Signed)

## 2013-12-10 NOTE — ED Provider Notes (Signed)
Medical screening examination/treatment/procedure(s) were performed by non-physician practitioner and as supervising physician I was immediately available for consultation/collaboration.  Madalyn Legner T Charrisse Masley, MD 12/10/13 0729 

## 2014-02-15 IMAGING — CT CT ABD-PELV W/O CM
1 of 2 series · 15 of 32 positions shown, 19 images · non-contrast
Comparison: None.

CLINICAL DATA: Rectal pain and pressure.

CT ABDOMEN AND PELVIS WITHOUT CONTRAST
TECHNIQUE: Multidetector CT imaging of the abdomen and pelvis was
performed following the standard protocol without intravenous
contrast.

[Series 2: abd/pelv w/o 5.0 b31f st · axial · non-contrast · 0.80mm/px · z∈[-597,-157]mm · 15 of 98 slices shown, 19 images]
[im 5/98  soft-tissue]
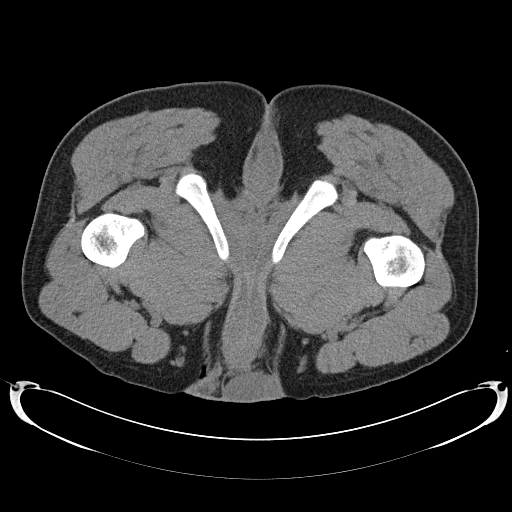
[im 5/98  bone]
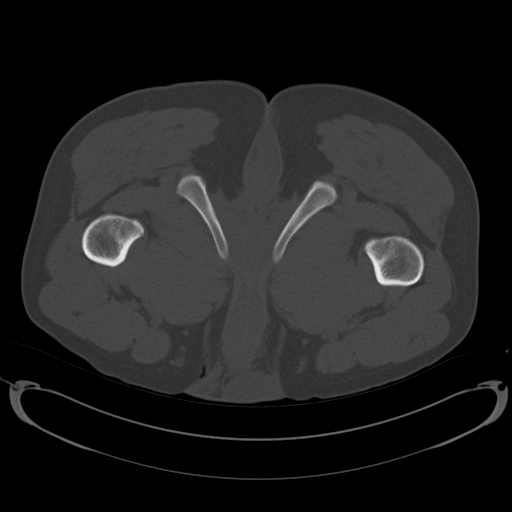
[im 13/98  soft-tissue]
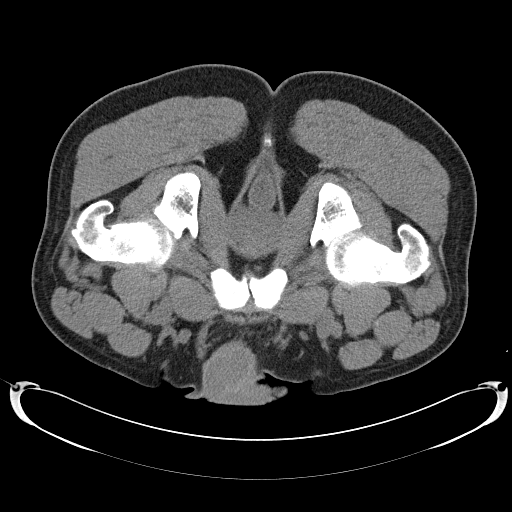
[im 21/98  soft-tissue]
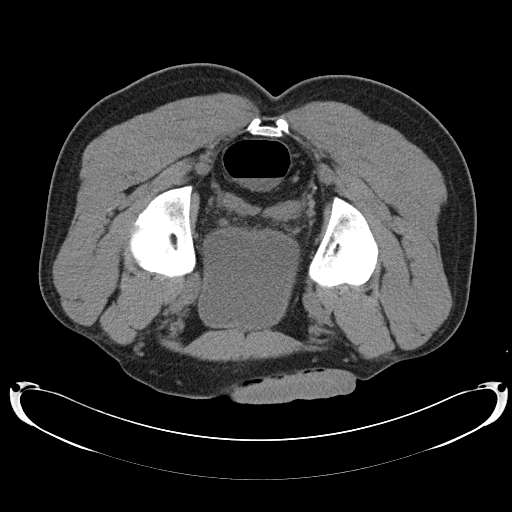
[im 29/98  soft-tissue]
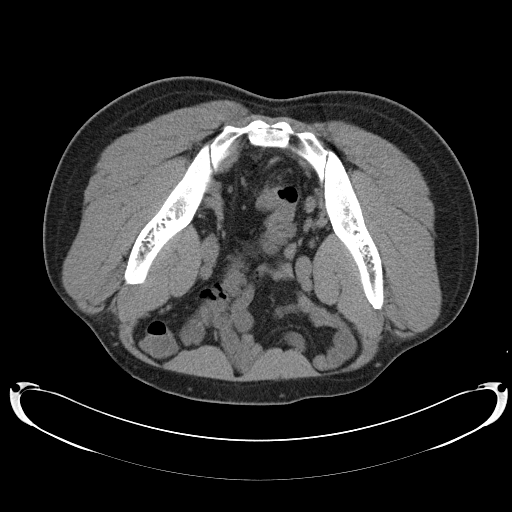
[im 33/98  soft-tissue]
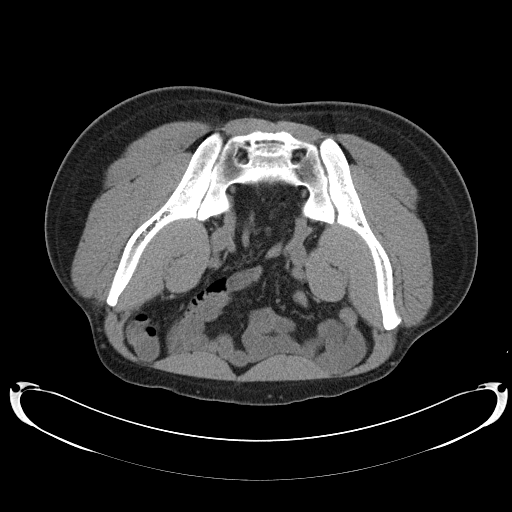
[im 41/98  soft-tissue]
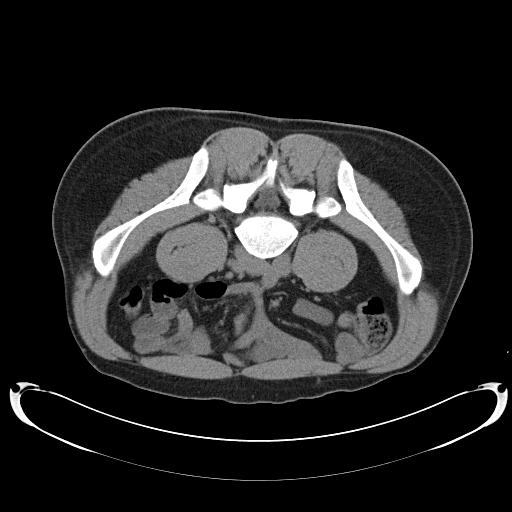
[im 49/98  soft-tissue]
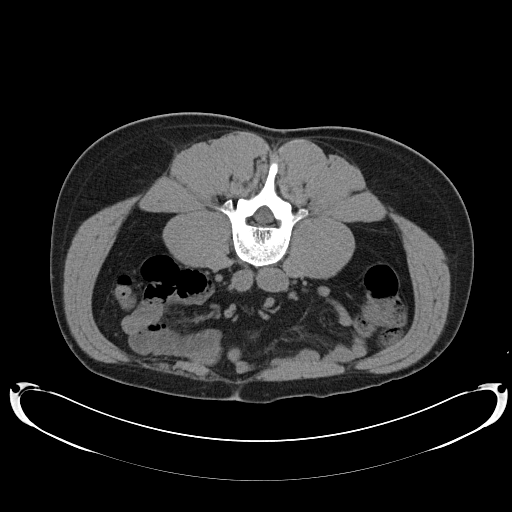
[im 57/98  soft-tissue]
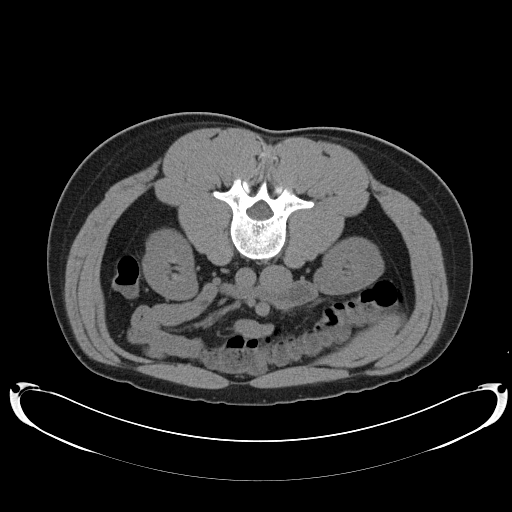
[im 65/98  soft-tissue]
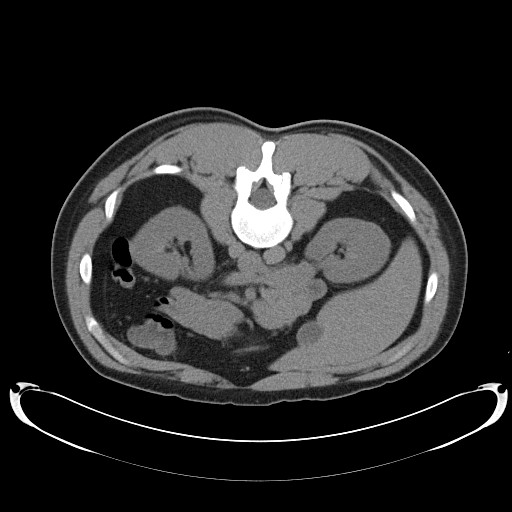
[im 65/98  bone]
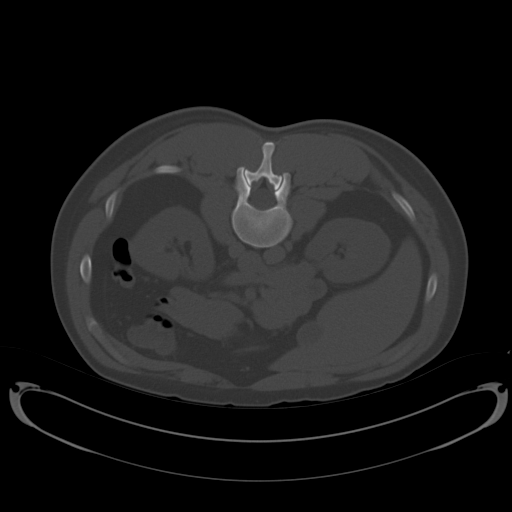
[im 69/98  soft-tissue]
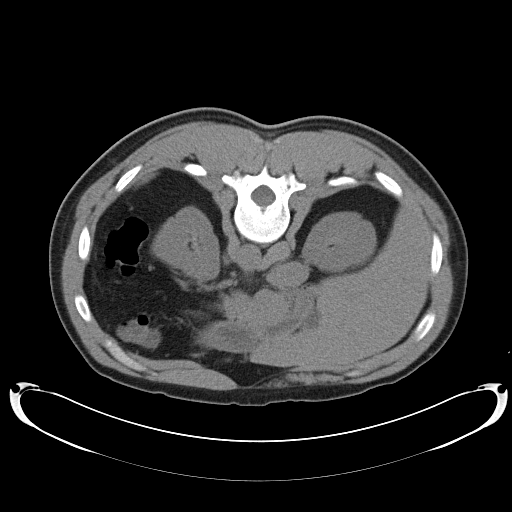
[im 77/98  soft-tissue]
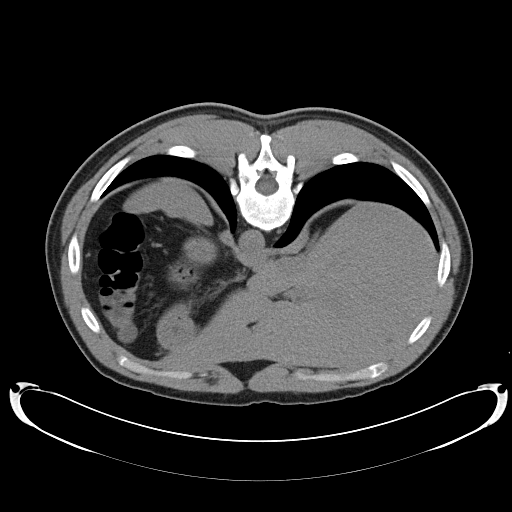
[im 81/98  lung]
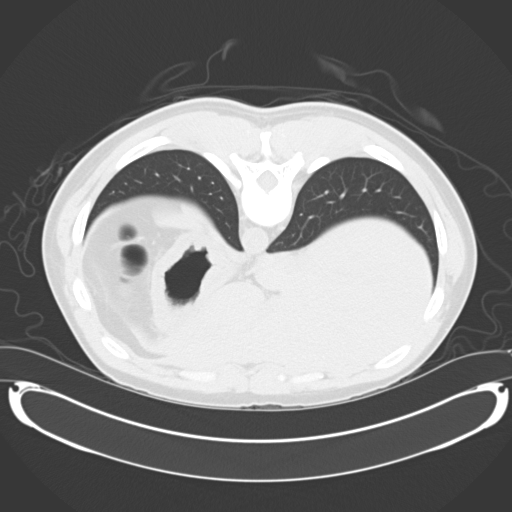
[im 85/98  soft-tissue]
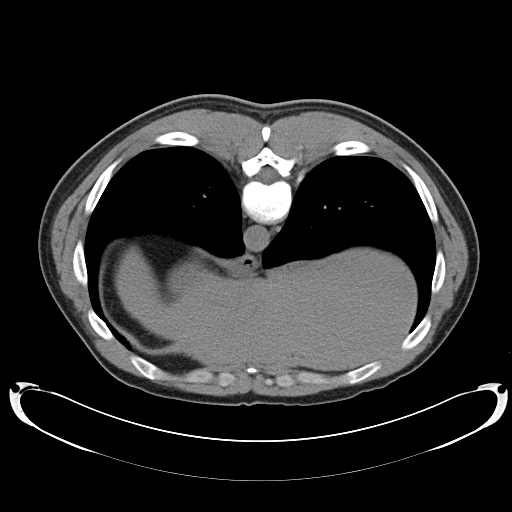
[im 85/98  lung]
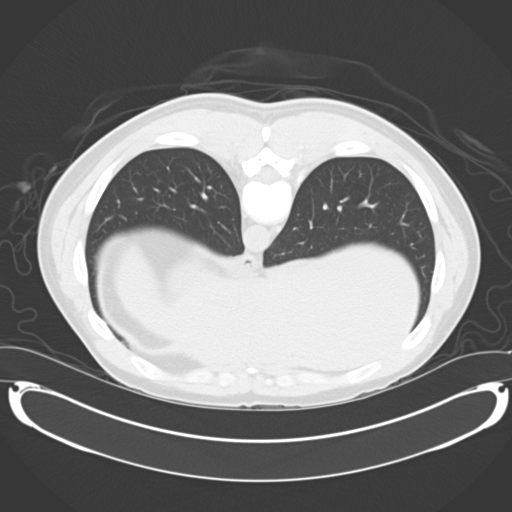
[im 89/98  lung]
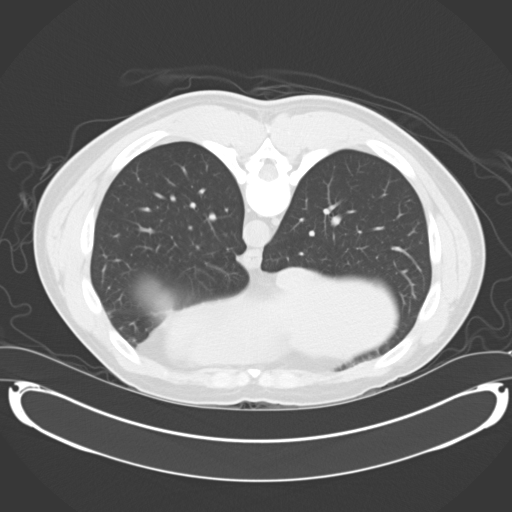
[im 93/98  soft-tissue]
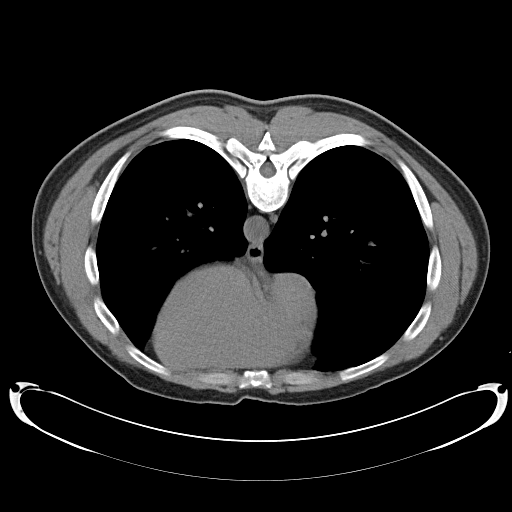
[im 93/98  lung]
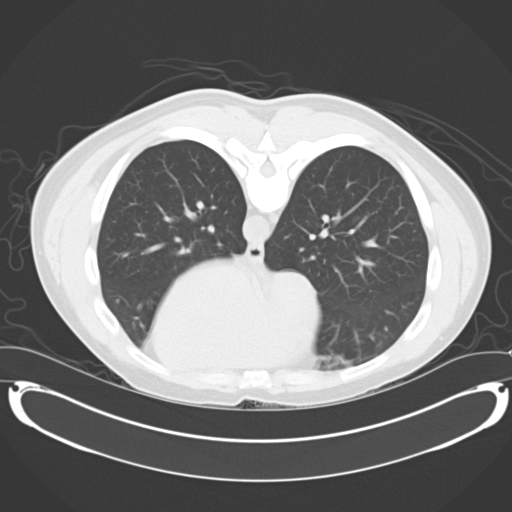

[15 of 32 positions shown; findings below may reference images not displayed]

FINDINGS: The patient was unable to lay supine and accordingly was
scanned in the prone position.

Subsegmental atelectasis noted in the right middle lobe.

The visualized portion of the liver, spleen, pancreas, and adrenal
glands appear unremarkable in noncontrast CT appearance.

The gallbladder is moderately contracted. The kidneys appear
unremarkable, as do the proximal ureters.

Distal ureters appear normal.

No pathologic retroperitoneal or porta hepatis adenopathy is
identified.

The appendix appears normal. No pathologic pelvic adenopathy is
identified.

There are some distal air-fluid levels in the colon and in the
rectum, suggesting constipation.  No obvious rectal mass is
observed on noncontrast CT.  No abnormal stranding in the issue
rectal fossa.
IMPRESSION: 1.  Air fluid levels in the distal colon raise the possibility of
diarrheal process.   Otherwise, no significant abnormality
identified.

## 2015-11-27 IMAGING — CR DG FOOT COMPLETE 3+V*L*
3 series · 3 of 3 positions shown · non-contrast
Comparison: 04/27/2004

CLINICAL DATA: Pain, no known injury

EXAM:
LEFT FOOT - COMPLETE 3+ VIEW

[x foot ap left]
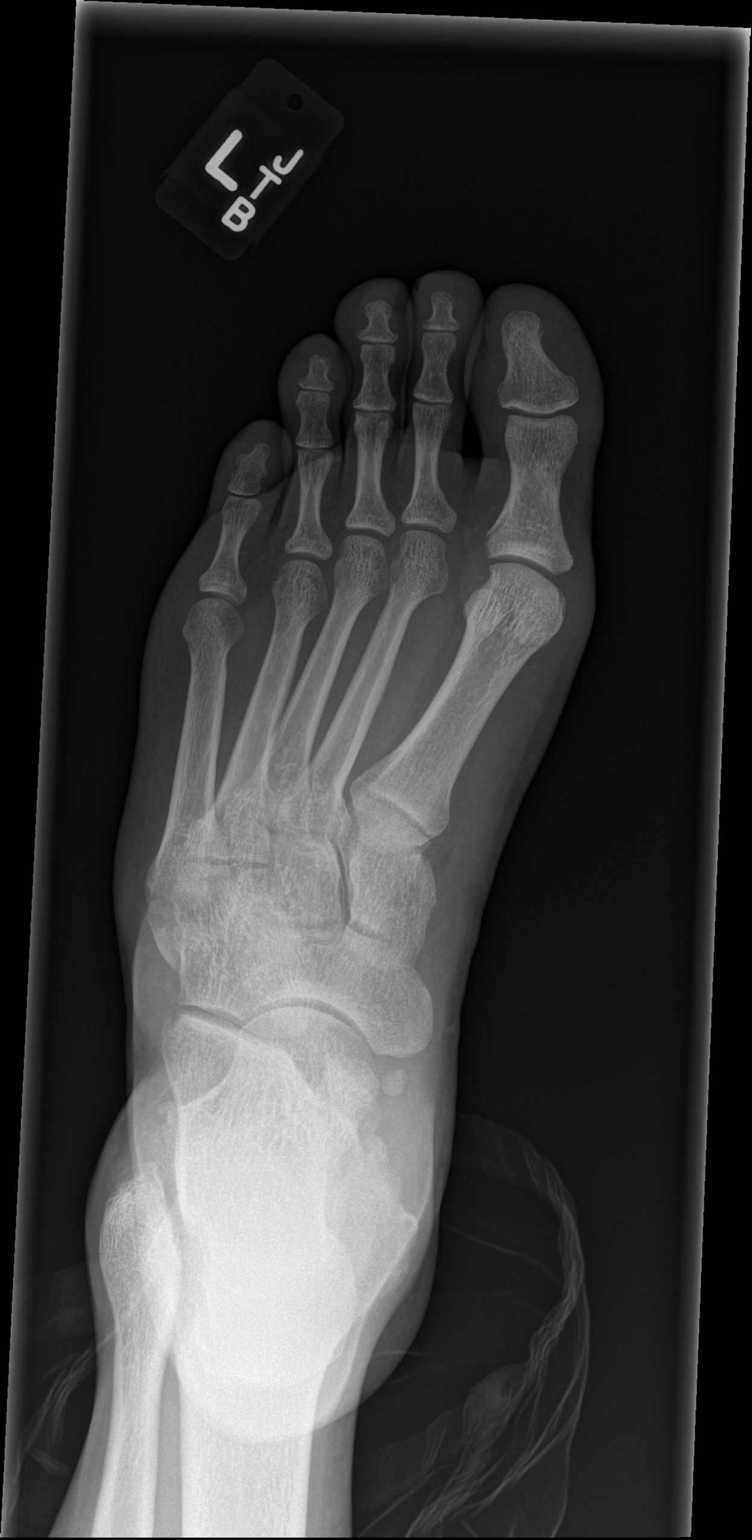

[x foot obl left]
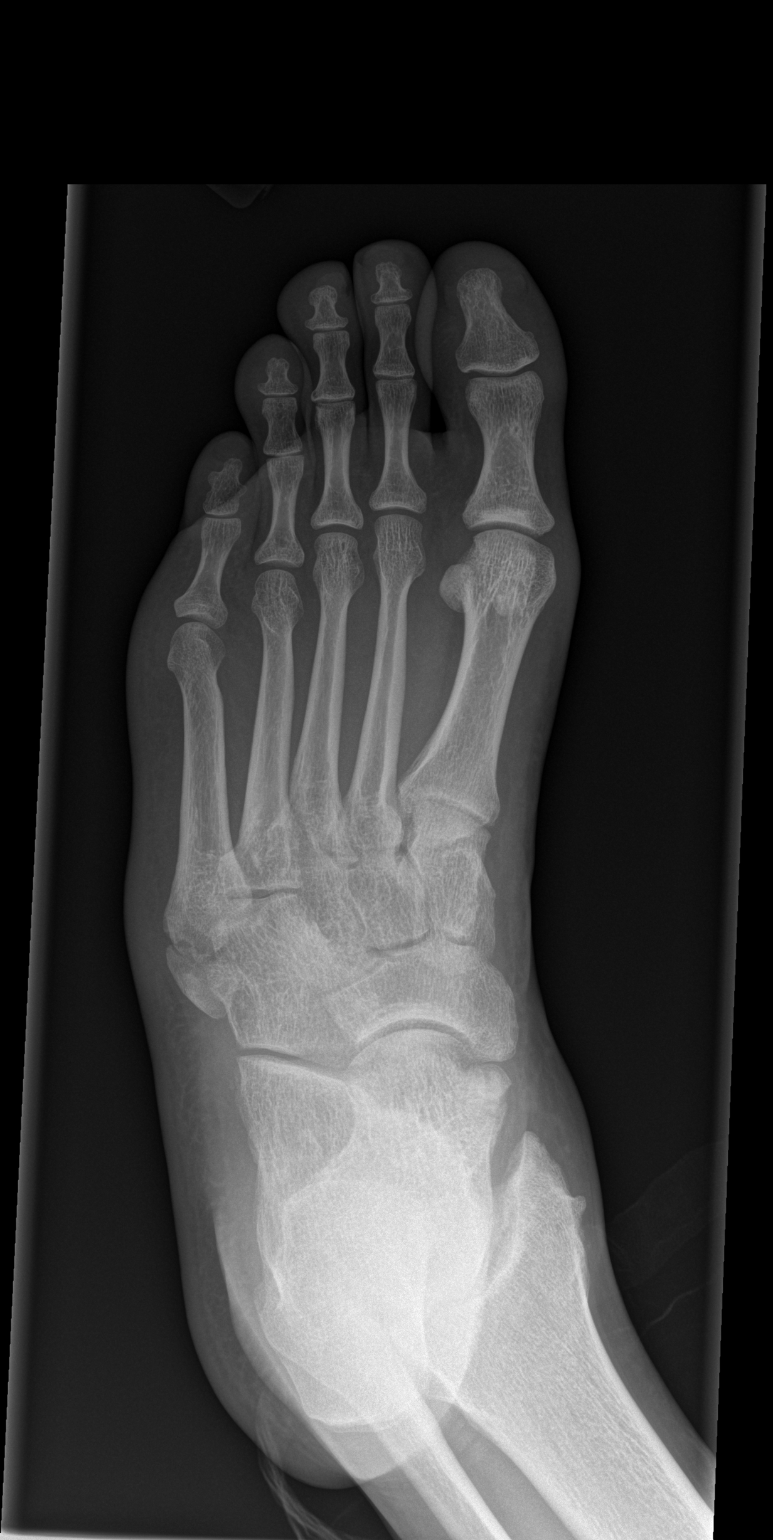

[x foot lat left]
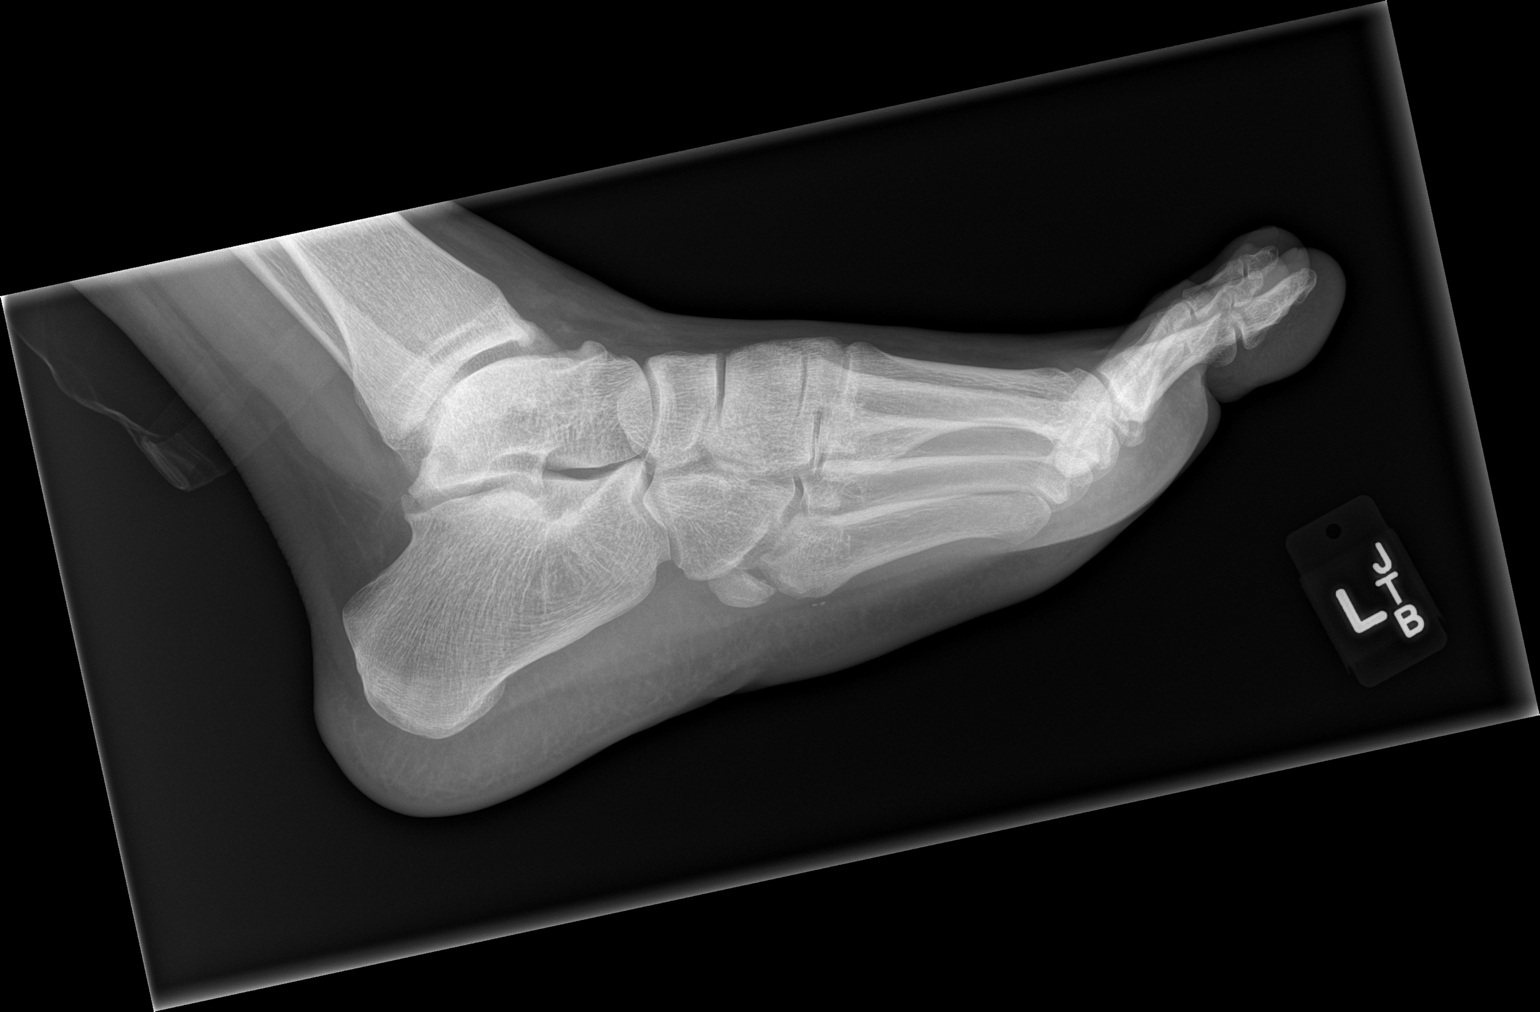

[3 of 3 positions shown; findings below may reference images not displayed]

FINDINGS: Three views of left foot submitted. No acute fracture or
subluxation. There is old fracture at the base of fifth metatarsal.
The fracture fragment is well corticated.
IMPRESSION: No acute fracture or subluxation. Old fracture at the base of fifth
metatarsal.

## 2019-07-07 NOTE — Progress Notes (Signed)
New Patient Note  RE: Steven Lozano MRN: [id number] DOB: Jan 20, 1986 Date of Office Visit: 07/08/2019  Referring provider: No ref. provider found Primary care provider: Default, Provider, MD  Chief Complaint: Food Intolerance (shellfish-stomach pain, mouth swelling)  History of Present Illness: I had the pleasure of seeing Steven Lozano for initial evaluation at the [facility] on 07/08/2019. He is a 34 y.o. male, who is self-referred here for the evaluation of shrimp allergy.  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 The first reaction occurred in 2009, after he ate small bite of cooked shrimp at a [employer].  Symptoms started within minutes and was in the form of right sided mouth swelling and throat discomfort. Denies any other symptoms. Denies any associated cofactors such as exertion, infection, NSAID use. The symptoms lasted for a few minutes and resolved without any medications.   In 2015, he was at a [employer]ibachi grill and tasted his rice and felt similar symptoms as above. There was shrimp cooked on the same platform. Symptoms resolved within a few minutes again without any mediations.   Patient used to eat shrimp prior to that but sometimes had issues with abdominal pains at times prior to these 2 reactions.   2 months ago patient had a piece of pre-cooked shrimp with no issues which prompted this visit.   He was not evaluated in ED. He does not have access to epinephrine autoinjector and not needed to use it.   Past work up includes: None. Dietary History: patient has been eating other foods including milk, eggs, peanut, treenuts, sesame, seafood, wheat, meats, fruits and vegetables, rice.  Limited soy ingestion.   He reports reading labels and avoiding shrimp in diet completely.   Assessment and Plan: Steven Lozano is a 34 y.o. male with: Allergy with anaphylaxis due to food, subsequent encounter Patient had issues with shrimp in the form of  abdominal pains however in 2009 had right sided mouth swelling and throat discomfort after ingestion of 1 bite of shrimp.  Symptoms resolved without any medication within a few minutes.  Then in 2015 had possible cross-contamination with shrimp resulting with similar symptoms as above.  2 months ago had 1 piece of pre cooked shrimp with no issues which prompted this visit.  No previous allergy evaluation.  Selected a few foods to skin prick test that may have been used at the [employer] grill. Also tested for dust mites as the protein in dust mites can cross react with shrimp.   Today's skin testing showed: Positive to shrimp. Borderline to soy and oyster. Negative to sesame. Positive to dust mites.  Continue strict avoidance of shrimp - shellfish. Okay to eat soy, sesame.   Discussed with patient that I'm not sure why he did not have a reaction most recently to shrimp but his skin prick testing was quite large. Offered bloodwork but patient has no insurance and will continue to avoid for now. Will repeat skin testing in 1 year.   I have given a sample of epinephrine injectable and demonstrated proper use (Patient is self pay and has no insurance). For mild symptoms you can take over the counter antihistamines such as Benadryl and monitor symptoms closely. If symptoms worsen or if you have severe symptoms including breathing issues, throat closure, significant swelling, whole body hives, severe diarrhea and vomiting, lightheadedness then inject epinephrine and seek immediate medical care afterwards.  Food action plan given.  Other allergic rhinitis Mild rhinitis symptoms during  pollen season.  Only checked for dust mites given above shrimp allergy concerns and possible cross-reactivity to dust mites.  May use over the counter antihistamines such as Zyrtec (cetirizine), Claritin (loratadine), Allegra (fexofenadine), or Xyzal (levocetirizine) daily as needed.  See below for dust mite control.    Return in about 1 year (around 07/07/2020).  Other allergy screening: Asthma: no Rhino conjunctivitis:  Mild symptoms to the outdoor pollen. Medication allergy: no Hymenoptera allergy: no Urticaria: no Eczema:no History of recurrent infections suggestive of immunodeficency: no  Diagnostics: Skin Testing: Select foods. Positive to shrimp. Borderline to soy and oyster. Negative to sesame. Positive to dust mites. Results discussed with patient/family. Airborne Adult Perc - 07/08/19 1412    Time Antigen Placed  1415    Allergen Manufacturer  Waynette Buttery    Location  Back    Number of Test  2    51. Mite, D Farinae  5,000 AU/ml  2+    52. Mite, D Pteronyssinus  5,000 AU/ml  2+     Food Adult Perc - 07/08/19 1400    Time Antigen Placed  1410    Allergen Manufacturer  Waynette Buttery    Location  Back    Number of allergen test  6     Control-buffer 50% Glycerol  Negative    Control-Histamine 1 mg/ml  2+    2. Soybean  --   2x2   4. Sesame  Negative    25. Shrimp  --   16x5   28. Oyster  --   +/-      Past Medical History: Patient Active Problem List   Diagnosis Date Noted  . Allergy with anaphylaxis due to food, subsequent encounter 07/08/2019  . Other allergic rhinitis 07/08/2019   History reviewed. No pertinent past medical history. Past Surgical History: Past Surgical History:  Procedure Laterality Date  . rectal abscess     Medication List:  Current Outpatient Medications  Medication Sig Dispense Refill  . HYDROcodone-homatropine (HYCODAN) 5-1.5 MG/5ML syrup Take 5 mLs by mouth every 6 (six) hours as needed for cough. (Patient not taking: Reported on 07/08/2019) 120 mL 0  . meloxicam (MOBIC) 7.5 MG tablet Take 2 tablets (15 mg total) by mouth daily. (Patient not taking: Reported on 07/08/2019) 30 tablet 0  . traMADol (ULTRAM) 50 MG tablet Take 1 tablet (50 mg total) by mouth every 6 (six) hours as needed. (Patient not taking: Reported on 07/08/2019) 15 tablet 0   No current  facility-administered medications for this visit.   Allergies: Allergies  Allergen Reactions  . Shellfish Allergy Swelling   Social History: Social History   Socioeconomic History  . Marital status: Single    Spouse name: Not on file  . Number of children: Not on file  . Years of education: Not on file  . Highest education level: Not on file  Occupational History  . Not on file  Tobacco Use  . Smoking status: Current Every Day Smoker    Packs/day: 1.00    Types: Cigarettes  . Smokeless tobacco: Never Used  Substance and Sexual Activity  . Alcohol use: Yes    Comment: daily  . Drug use: No  . Sexual activity: Yes  Other Topics Concern  . Not on file  Social History Narrative  . Not on file   Social Determinants of Health   Financial Resource Strain:   . Difficulty of Paying Living Expenses:   Food Insecurity:   . Worried About Programme researcher, broadcasting/film/video  in the Last Year:   . Ran Out of Food in the Last Year:   Transportation Needs:   . Freight forwarder (Medical):   Marland Kitchen Lack of Transportation (Non-Medical):   Physical Activity:   . Days of Exercise per Week:   . Minutes of Exercise per Session:   Stress:   . Feeling of Stress :   Social Connections:   . Frequency of Communication with Friends and Family:   . Frequency of Social Gatherings with Friends and Family:   . Attends Religious Services:   . Active Member of Clubs or Organizations:   . Attends Banker Meetings:   Marland Kitchen Marital Status:    Lives in a 36 year old home. Smoking: 1 pack per day x 10 years Occupation: truck Interior and spatial designer HistorySurveyor, minerals in the house: no Engineer, civil (consulting) in the family room: yes Carpet in the bedroom: yes Heating: electric Cooling: central Pet: yes 1 dog x 3 yrs  Family History: Family History  Problem Relation Age of Onset  . Hypertension Mother   . Cancer Maternal Grandmother    Problem                               Relation Asthma                                    No  Eczema                                No  Food allergy                          Mother  Allergic rhino conjunctivitis     No   Review of Systems  Constitutional: Negative for appetite change, chills, fever and unexpected weight change.  HENT: Negative for congestion and rhinorrhea.   Eyes: Negative for itching.  Respiratory: Negative for cough, chest tightness, shortness of breath and wheezing.   Cardiovascular: Negative for chest pain.  Gastrointestinal: Negative for abdominal pain.  Genitourinary: Negative for difficulty urinating.  Skin: Negative for rash.  Allergic/Immunologic: Positive for environmental allergies and food allergies.  Neurological: Negative for headaches.   Objective: BP 118/82 (BP Location: Right Arm, Patient Position: Sitting, Cuff Size: Normal)   Pulse 100   Temp (!) 97 F (36.1 C) (Temporal)   Resp 18   Ht 5' 11.8" (1.824 m)   Wt 238 lb 9.6 oz (108.2 kg)   SpO2 98%   BMI 32.54 kg/m  Body mass index is 32.54 kg/m. Physical Exam  Constitutional: He is oriented to person, place, and time. He appears well-developed and well-nourished.  HENT:  Head: Normocephalic and atraumatic.  Right Ear: External ear normal.  Left Ear: External ear normal.  Nose: Nose normal.  Mouth/Throat: Oropharynx is clear and moist.  Eyes: Conjunctivae and EOM are normal.  Cardiovascular: Normal rate, regular rhythm and normal heart sounds. Exam reveals no gallop and no friction rub.  No murmur heard. Pulmonary/Chest: Effort normal and breath sounds normal. He has no wheezes. He has no rales.  Musculoskeletal:     Cervical back: Neck supple.  Neurological: He is alert and oriented to person, place, and time.  Skin: Skin is warm. No rash noted.  Psychiatric:  He has a normal mood and affect. His behavior is normal.  Nursing note and vitals reviewed.  The plan was reviewed with the patient/family, and all questions/concerned were addressed.  It was  my pleasure to see Steven Lozano today and participate in his care. Please feel free to contact me with any questions or concerns.  Sincerely,  Wyline Mood, DO Allergy & Immunology  [facility] office: [phone] [facility] office: [phone] [place] office: [phone]

## 2019-07-08 ENCOUNTER — Other Ambulatory Visit: Payer: Self-pay

## 2019-07-08 ENCOUNTER — Ambulatory Visit: Payer: Self-pay | Admitting: Allergy

## 2019-07-08 ENCOUNTER — Encounter: Payer: Self-pay | Admitting: Allergy

## 2019-07-08 VITALS — BP 118/82 | HR 100 | Temp 97.0°F | Resp 18 | Ht 71.8 in | Wt 238.6 lb

## 2019-07-08 DIAGNOSIS — J3089 Other allergic rhinitis: Secondary | ICD-10-CM | POA: Insufficient documentation

## 2019-07-08 DIAGNOSIS — T7800XD Anaphylactic reaction due to unspecified food, subsequent encounter: Secondary | ICD-10-CM

## 2019-07-08 NOTE — Patient Instructions (Addendum)
Today's skin testing showed: Positive to shrimp. Borderline to soy and oyster. Negative to sesame. Positive to dust mites.   Continue strict avoidance of shrimp - shellfish.  I have prescribed epinephrine injectable and demonstrated proper use. For mild symptoms you can take over the counter antihistamines such as Benadryl and monitor symptoms closely. If symptoms worsen or if you have severe symptoms including breathing issues, throat closure, significant swelling, whole body hives, severe diarrhea and vomiting, lightheadedness then inject epinephrine and seek immediate medical care afterwards.  Sample given.   Food action plan given.  Environmental  May use over the counter antihistamines such as Zyrtec (cetirizine), Claritin (loratadine), Allegra (fexofenadine), or Xyzal (levocetirizine) daily as needed.  See below for dust mite control.   Follow up in 1 year or sooner if needed.  Control of House Dust Mite Allergen . Dust mite allergens are a common trigger of allergy and asthma symptoms. While they can be found throughout the house, these microscopic creatures thrive in warm, humid environments such as bedding, upholstered furniture and carpeting. . Because so much time is spent in the bedroom, it is essential to reduce mite levels there.  . Encase pillows, mattresses, and box springs in special allergen-proof fabric covers or airtight, zippered plastic covers.  . Bedding should be washed weekly in hot water (130 F) and dried in a hot dryer. Allergen-proof covers are available for comforters and pillows that can't be regularly washed.  Reyes Ivan the allergy-proof covers every few months. Minimize clutter in the bedroom. Keep pets out of the bedroom.  Marland Kitchen Keep humidity less than 50% by using a dehumidifier or air conditioning. You can buy a humidity measuring device called a hygrometer to monitor this.  . If possible, replace carpets with hardwood, linoleum, or washable area rugs. If  that's not possible, vacuum frequently with a vacuum that has a HEPA filter. . Remove all upholstered furniture and non-washable window drapes from the bedroom. . Remove all non-washable stuffed toys from the bedroom.  Wash stuffed toys weekly.

## 2019-07-08 NOTE — Assessment & Plan Note (Signed)
Patient had issues with shrimp in the form of abdominal pains however in 2009 had right sided mouth swelling and throat discomfort after ingestion of 1 bite of shrimp.  Symptoms resolved without any medication within a few minutes.  Then in 2015 had possible cross-contamination with shrimp resulting with similar symptoms as above.  2 months ago had 1 piece of pre cooked shrimp with no issues which prompted this visit.  No previous allergy evaluation.  Selected a few foods to skin prick test that may have been used at the [employer] [place]. Also tested for dust mites as the protein in dust mites can cross react with shrimp.   Today's skin testing showed: Positive to shrimp. Borderline to soy and oyster. Negative to sesame. Positive to dust mites.  Continue strict avoidance of shrimp - shellfish. Okay to eat soy, sesame.   Discussed with patient that I'm not sure why he did not have a reaction most recently to shrimp but his skin prick testing was quite large. Offered bloodwork but patient has no insurance and will continue to avoid for now. Will repeat skin testing in 1 year.   I have given a sample of epinephrine injectable and demonstrated proper use (Patient is self pay and has no insurance). For mild symptoms you can take over the counter antihistamines such as Benadryl and monitor symptoms closely. If symptoms worsen or if you have severe symptoms including breathing issues, throat closure, significant swelling, whole body hives, severe diarrhea and vomiting, lightheadedness then inject epinephrine and seek immediate medical care afterwards.  Food action plan given.

## 2019-07-08 NOTE — Assessment & Plan Note (Signed)
Mild rhinitis symptoms during pollen season.  Only checked for dust mites given above shrimp allergy concerns and possible cross-reactivity to dust mites.  May use over the counter antihistamines such as Zyrtec (cetirizine), Claritin (loratadine), Allegra (fexofenadine), or Xyzal (levocetirizine) daily as needed.  See below for dust mite control.

## 2021-11-28 ENCOUNTER — Ambulatory Visit (HOSPITAL_COMMUNITY)
Admission: EM | Admit: 2021-11-28 | Discharge: 2021-11-28 | Disposition: A | Discharge status: Home / Self Care | Payer: Self-pay | Attending: Urgent Care | Admitting: Urgent Care

## 2021-11-28 ENCOUNTER — Encounter (HOSPITAL_COMMUNITY): Payer: Self-pay

## 2021-11-28 DIAGNOSIS — R030 Elevated blood-pressure reading, without diagnosis of hypertension: Secondary | ICD-10-CM

## 2021-11-28 DIAGNOSIS — K429 Umbilical hernia without obstruction or gangrene: Secondary | ICD-10-CM

## 2021-11-28 LAB — POCT URINALYSIS DIPSTICK, ED / UC
Bilirubin Urine: NEGATIVE
Glucose, UA: NEGATIVE mg/dL
Ketones, ur: NEGATIVE mg/dL
Leukocytes,Ua: NEGATIVE
Nitrite: NEGATIVE
Protein, ur: NEGATIVE mg/dL
Specific Gravity, Urine: 1.02 (ref 1.005–1.030)
Urobilinogen, UA: 0.2 mg/dL (ref 0.0–1.0)
pH: 6.5 (ref 5.0–8.0)

## 2021-11-28 NOTE — ED Provider Notes (Addendum)
[facility]    CSN: [id number] Arrival date & time: 11/28/21  1419      History   Chief Complaint Chief Complaint  Patient presents with   Abdominal Pain    HPI Steven Lozano is a 36 y.o. male.   Presents with c/o midabdominal bulge associated with peri-umbilical (superior) pain. Concerning symptoms started a couple of days ago after straining to have a BM. He noticed a bulge that he was able reduce which caused it to "feel better".  Endorses normal appetite. Reports constipation which causes him to strain. He believes he has hemorrhoids with bleeding as recently a few days ago.  Patient also states that he noticed some aching in his groin (bilateral) with a bulge (side unsure) about 1 month ago.   Abdominal Pain   History reviewed. No pertinent past medical history.  Patient Active Problem List   Diagnosis Date Noted   Allergy with anaphylaxis due to food, subsequent encounter 07/08/2019   Other allergic rhinitis 07/08/2019    Past Surgical History:  Procedure Laterality Date   rectal abscess         Home Medications    Prior to Admission medications   Medication Sig Start Date End Date Taking? Authorizing Provider  HYDROcodone-homatropine (HYCODAN) 5-1.5 MG/5ML syrup Take 5 mLs by mouth every 6 (six) hours as needed for cough. Patient not taking: Reported on 07/08/2019 12/06/13   Santiago Glad, PA-C  meloxicam (MOBIC) 7.5 MG tablet Take 2 tablets (15 mg total) by mouth daily. Patient not taking: Reported on 07/08/2019 07/26/13   Lurene Shadow, PA-C  traMADol (ULTRAM) 50 MG tablet Take 1 tablet (50 mg total) by mouth every 6 (six) hours as needed. Patient not taking: Reported on 07/08/2019 07/26/13   Lurene Shadow, PA-C    Family History Family History  Problem Relation Age of Onset   Hypertension Mother    Cancer Maternal Grandmother     Social History Social History   Tobacco Use   Smoking status: Every Day    Packs/day: 1.00     Types: Cigarettes   Smokeless tobacco: Never  Vaping Use   Vaping Use: Never used  Substance Use Topics   Alcohol use: Yes    Comment: daily   Drug use: No     Allergies   Shellfish allergy   Review of Systems Review of Systems   Physical Exam Triage Vital Signs ED Triage Vitals [11/28/21 1428]  Enc Vitals Group     BP (!) 151/107     Pulse Rate 72     Resp 16     Temp 98.4 F (36.9 C)     Temp Source Oral     SpO2 100 %     Weight      Height      Head Circumference      Peak Flow      Pain Score      Pain Loc      Pain Edu?      Excl. in GC?    No data found.  Updated Vital Signs BP (!) 151/107 (BP Location: Left Arm)   Pulse 72   Temp 98.4 F (36.9 C) (Oral)   Resp 16   SpO2 100%   Visual Acuity Right Eye Distance:   Left Eye Distance:   Bilateral Distance:    Right Eye Near:   Left Eye Near:    Bilateral Near:     Physical Exam Vitals reviewed.  Constitutional:      Appearance: He is well-developed.  Abdominal:     Hernia: A hernia is present. Hernia is present in the umbilical area. There is no hernia in the left inguinal area or right inguinal area.  Skin:    General: Skin is warm and dry.  Neurological:     General: No focal deficit present.     Mental Status: He is alert.  Psychiatric:        Mood and Affect: Mood normal.        Behavior: Behavior normal.      UC Treatments / Results  Labs (all labs ordered are listed, but only abnormal results are displayed) Labs Reviewed  POCT URINALYSIS DIPSTICK, ED / UC  POCT URINALYSIS DIPSTICK, ED / UC    EKG   Radiology No results found.  Procedures Procedures (including critical care time)  Medications Ordered in UC Medications - No data to display  Initial Impression / Assessment and Plan / UC Course  I have reviewed the triage vital signs and the nursing notes.  Pertinent labs & imaging results that were available during my care of the patient were reviewed by me and  considered in my medical decision making (see chart for details).  Patient presents with umbilical hernia present on exam.  Umbilicus is tender to palpation.  educible without concern for immediate strangulation or incarceration.  No inguinal hernia is appreciated on exam.  Patient is directed to establish care with a primary care provider and was provided resources for same.  Recommended that patient schedule visit with PCP and request imaging and/or referral to surgeon for evaluation and possible need for treatment of umbilical hernia.  Also noted elevated blood pressure today and possible need for chronic treatment.  Reviewed return precautions for emergent treatment of strangulation.  Discussed signs and symptoms.  Patient acknowledges these instructions.  All questions answered.     Final Clinical Impressions(s) / UC Diagnoses   Final diagnoses:  None   Discharge Instructions   None    ED Prescriptions   None    PDMP not reviewed this encounter.   Charma Igo, FNP 11/28/21 1504    ImmordinoJeannett Senior, FNP 11/28/21 1506

## 2021-11-28 NOTE — Discharge Instructions (Addendum)
Please follow-up by establishing care with a primary care provider who will order imaging and/or refer you to a surgeon for evaluation and possible need for treatment.

## 2021-11-28 NOTE — ED Triage Notes (Signed)
Pt reports abdominal pain x 3 days.  Pt reports a knot in the center of his stomach. Pt reports some discomfort after urinating.

## 2021-12-13 ENCOUNTER — Ambulatory Visit: Payer: Self-pay | Admitting: Family Medicine
